# Patient Record
Sex: Female | Born: 1985 | Race: White | Hispanic: No | Marital: Married | Smoking: Never smoker
Health system: Southern US, Community
[De-identification: ages and names within clinical notes are randomized; demographics above are authoritative.]

## PROBLEM LIST (undated history)

## (undated) DIAGNOSIS — E039 Hypothyroidism, unspecified: Secondary | ICD-10-CM

## (undated) DIAGNOSIS — O24419 Gestational diabetes mellitus in pregnancy, unspecified control: Secondary | ICD-10-CM

## (undated) DIAGNOSIS — O139 Gestational [pregnancy-induced] hypertension without significant proteinuria, unspecified trimester: Secondary | ICD-10-CM

## (undated) HISTORY — PX: FACIAL FRACTURE SURGERY: SHX1570

---

## 2016-05-22 DIAGNOSIS — Z01419 Encounter for gynecological examination (general) (routine) without abnormal findings: Secondary | ICD-10-CM | POA: Diagnosis not present

## 2016-05-22 DIAGNOSIS — Z30011 Encounter for initial prescription of contraceptive pills: Secondary | ICD-10-CM | POA: Diagnosis not present

## 2016-08-30 DIAGNOSIS — Z7251 High risk heterosexual behavior: Secondary | ICD-10-CM | POA: Diagnosis not present

## 2018-01-04 DIAGNOSIS — Z3042 Encounter for surveillance of injectable contraceptive: Secondary | ICD-10-CM | POA: Diagnosis not present

## 2018-04-14 DIAGNOSIS — Z01419 Encounter for gynecological examination (general) (routine) without abnormal findings: Secondary | ICD-10-CM | POA: Diagnosis not present

## 2018-04-14 DIAGNOSIS — Z6834 Body mass index (BMI) 34.0-34.9, adult: Secondary | ICD-10-CM | POA: Diagnosis not present

## 2018-04-14 DIAGNOSIS — Z3200 Encounter for pregnancy test, result unknown: Secondary | ICD-10-CM | POA: Diagnosis not present

## 2018-04-14 DIAGNOSIS — Z1151 Encounter for screening for human papillomavirus (HPV): Secondary | ICD-10-CM | POA: Diagnosis not present

## 2018-05-04 DIAGNOSIS — Z1329 Encounter for screening for other suspected endocrine disorder: Secondary | ICD-10-CM | POA: Diagnosis not present

## 2018-05-04 DIAGNOSIS — Z0000 Encounter for general adult medical examination without abnormal findings: Secondary | ICD-10-CM | POA: Diagnosis not present

## 2018-05-04 DIAGNOSIS — Z13220 Encounter for screening for lipoid disorders: Secondary | ICD-10-CM | POA: Diagnosis not present

## 2018-05-04 DIAGNOSIS — Z130 Encounter for screening for diseases of the blood and blood-forming organs and certain disorders involving the immune mechanism: Secondary | ICD-10-CM | POA: Diagnosis not present

## 2018-05-18 DIAGNOSIS — D691 Qualitative platelet defects: Secondary | ICD-10-CM | POA: Diagnosis not present

## 2018-06-16 DIAGNOSIS — W57XXXA Bitten or stung by nonvenomous insect and other nonvenomous arthropods, initial encounter: Secondary | ICD-10-CM | POA: Diagnosis not present

## 2018-06-16 DIAGNOSIS — S80851A Superficial foreign body, right lower leg, initial encounter: Secondary | ICD-10-CM | POA: Diagnosis not present

## 2019-04-29 DIAGNOSIS — Z6835 Body mass index (BMI) 35.0-35.9, adult: Secondary | ICD-10-CM | POA: Diagnosis not present

## 2019-04-29 DIAGNOSIS — Z01419 Encounter for gynecological examination (general) (routine) without abnormal findings: Secondary | ICD-10-CM | POA: Diagnosis not present

## 2019-09-13 DIAGNOSIS — M79673 Pain in unspecified foot: Secondary | ICD-10-CM | POA: Diagnosis not present

## 2019-11-22 DIAGNOSIS — M79672 Pain in left foot: Secondary | ICD-10-CM | POA: Diagnosis not present

## 2019-11-22 DIAGNOSIS — M25562 Pain in left knee: Secondary | ICD-10-CM | POA: Diagnosis not present

## 2020-01-17 DIAGNOSIS — R350 Frequency of micturition: Secondary | ICD-10-CM | POA: Diagnosis not present

## 2020-01-17 DIAGNOSIS — R1032 Left lower quadrant pain: Secondary | ICD-10-CM | POA: Diagnosis not present

## 2020-01-17 DIAGNOSIS — Z3202 Encounter for pregnancy test, result negative: Secondary | ICD-10-CM | POA: Diagnosis not present

## 2020-01-31 DIAGNOSIS — R1031 Right lower quadrant pain: Secondary | ICD-10-CM | POA: Diagnosis not present

## 2020-02-22 DIAGNOSIS — H9193 Unspecified hearing loss, bilateral: Secondary | ICD-10-CM | POA: Diagnosis not present

## 2020-02-22 DIAGNOSIS — H6123 Impacted cerumen, bilateral: Secondary | ICD-10-CM | POA: Diagnosis not present

## 2020-08-10 DIAGNOSIS — Z13220 Encounter for screening for lipoid disorders: Secondary | ICD-10-CM | POA: Diagnosis not present

## 2020-08-10 DIAGNOSIS — Z130 Encounter for screening for diseases of the blood and blood-forming organs and certain disorders involving the immune mechanism: Secondary | ICD-10-CM | POA: Diagnosis not present

## 2020-08-10 DIAGNOSIS — Z6835 Body mass index (BMI) 35.0-35.9, adult: Secondary | ICD-10-CM | POA: Diagnosis not present

## 2020-08-10 DIAGNOSIS — Z0000 Encounter for general adult medical examination without abnormal findings: Secondary | ICD-10-CM | POA: Diagnosis not present

## 2020-08-10 DIAGNOSIS — Z01419 Encounter for gynecological examination (general) (routine) without abnormal findings: Secondary | ICD-10-CM | POA: Diagnosis not present

## 2020-08-10 DIAGNOSIS — Z1329 Encounter for screening for other suspected endocrine disorder: Secondary | ICD-10-CM | POA: Diagnosis not present

## 2020-10-15 DIAGNOSIS — R7989 Other specified abnormal findings of blood chemistry: Secondary | ICD-10-CM | POA: Diagnosis not present

## 2020-10-15 DIAGNOSIS — Z113 Encounter for screening for infections with a predominantly sexual mode of transmission: Secondary | ICD-10-CM | POA: Diagnosis not present

## 2020-11-22 DIAGNOSIS — H52223 Regular astigmatism, bilateral: Secondary | ICD-10-CM | POA: Diagnosis not present

## 2020-11-22 DIAGNOSIS — H53143 Visual discomfort, bilateral: Secondary | ICD-10-CM | POA: Diagnosis not present

## 2021-01-02 DIAGNOSIS — Z1152 Encounter for screening for COVID-19: Secondary | ICD-10-CM | POA: Diagnosis not present

## 2021-01-10 DIAGNOSIS — Z1152 Encounter for screening for COVID-19: Secondary | ICD-10-CM | POA: Diagnosis not present

## 2021-01-11 DIAGNOSIS — Z20822 Contact with and (suspected) exposure to covid-19: Secondary | ICD-10-CM | POA: Diagnosis not present

## 2021-05-01 DIAGNOSIS — Z1329 Encounter for screening for other suspected endocrine disorder: Secondary | ICD-10-CM | POA: Diagnosis not present

## 2021-05-15 DIAGNOSIS — R635 Abnormal weight gain: Secondary | ICD-10-CM | POA: Diagnosis not present

## 2021-05-15 DIAGNOSIS — R5383 Other fatigue: Secondary | ICD-10-CM | POA: Diagnosis not present

## 2021-05-15 DIAGNOSIS — E039 Hypothyroidism, unspecified: Secondary | ICD-10-CM | POA: Diagnosis not present

## 2021-06-03 DIAGNOSIS — E039 Hypothyroidism, unspecified: Secondary | ICD-10-CM | POA: Diagnosis not present

## 2021-06-06 DIAGNOSIS — Z3689 Encounter for other specified antenatal screening: Secondary | ICD-10-CM | POA: Diagnosis not present

## 2021-06-06 DIAGNOSIS — Z3200 Encounter for pregnancy test, result unknown: Secondary | ICD-10-CM | POA: Diagnosis not present

## 2021-06-26 ENCOUNTER — Encounter (HOSPITAL_COMMUNITY): Payer: Self-pay | Admitting: *Deleted

## 2021-06-26 ENCOUNTER — Encounter (HOSPITAL_COMMUNITY)
Admission: AD | Disposition: A | Discharge status: Home / Self Care | Payer: Self-pay | Source: Home / Self Care | Attending: Obstetrics and Gynecology

## 2021-06-26 ENCOUNTER — Inpatient Hospital Stay (HOSPITAL_COMMUNITY): Payer: BC Managed Care – PPO | Admitting: Anesthesiology

## 2021-06-26 ENCOUNTER — Ambulatory Visit (HOSPITAL_COMMUNITY)
Admission: AD | Admit: 2021-06-26 | Discharge: 2021-06-26 | Disposition: A | Discharge status: Home / Self Care | Payer: BC Managed Care – PPO | Attending: Obstetrics and Gynecology | Admitting: Obstetrics and Gynecology

## 2021-06-26 DIAGNOSIS — E039 Hypothyroidism, unspecified: Secondary | ICD-10-CM | POA: Diagnosis not present

## 2021-06-26 DIAGNOSIS — O99280 Endocrine, nutritional and metabolic diseases complicating pregnancy, unspecified trimester: Secondary | ICD-10-CM | POA: Insufficient documentation

## 2021-06-26 DIAGNOSIS — O00101 Right tubal pregnancy without intrauterine pregnancy: Secondary | ICD-10-CM | POA: Insufficient documentation

## 2021-06-26 DIAGNOSIS — Z3A08 8 weeks gestation of pregnancy: Secondary | ICD-10-CM | POA: Diagnosis not present

## 2021-06-26 DIAGNOSIS — Z79890 Hormone replacement therapy (postmenopausal): Secondary | ICD-10-CM | POA: Insufficient documentation

## 2021-06-26 DIAGNOSIS — O009 Unspecified ectopic pregnancy without intrauterine pregnancy: Secondary | ICD-10-CM | POA: Diagnosis not present

## 2021-06-26 DIAGNOSIS — Z20822 Contact with and (suspected) exposure to covid-19: Secondary | ICD-10-CM | POA: Insufficient documentation

## 2021-06-26 HISTORY — DX: Hypothyroidism, unspecified: E03.9

## 2021-06-26 HISTORY — PX: DIAGNOSTIC LAPAROSCOPY WITH REMOVAL OF ECTOPIC PREGNANCY: SHX6449

## 2021-06-26 HISTORY — DX: Unspecified ectopic pregnancy without intrauterine pregnancy: O00.90

## 2021-06-26 HISTORY — PX: LAPAROSCOPIC UNILATERAL SALPINGECTOMY: SHX5934

## 2021-06-26 LAB — COMPREHENSIVE METABOLIC PANEL
ALT: 19 U/L (ref 0–44)
AST: 22 U/L (ref 15–41)
Albumin: 3.8 g/dL (ref 3.5–5.0)
Alkaline Phosphatase: 49 U/L (ref 38–126)
Anion gap: 7 (ref 5–15)
BUN: 11 mg/dL (ref 6–20)
CO2: 24 mmol/L (ref 22–32)
Calcium: 9.2 mg/dL (ref 8.9–10.3)
Chloride: 105 mmol/L (ref 98–111)
Creatinine, Ser: 0.77 mg/dL (ref 0.44–1.00)
GFR, Estimated: >60 mL/min (ref >60)
Glucose, Bld: 102 mg/dL — ABNORMAL HIGH (ref 70–99)
Potassium: 4.2 mmol/L (ref 3.5–5.1)
Sodium: 136 mmol/L (ref 135–145)
Total Bilirubin: 0.8 mg/dL (ref 0.3–1.2)
Total Protein: 7 g/dL (ref 6.5–8.1)

## 2021-06-26 LAB — TYPE AND SCREEN
ABO/RH(D): AB POS
Antibody Screen: NEGATIVE

## 2021-06-26 LAB — CBC
HCT: 40.3 % (ref 36.0–46.0)
Hemoglobin: 13.7 g/dL (ref 12.0–15.0)
MCH: 30.1 pg (ref 26.0–34.0)
MCHC: 34 g/dL (ref 30.0–36.0)
MCV: 88.6 fL (ref 80.0–100.0)
Platelets: 217 10*3/uL (ref 150–400)
RBC: 4.55 MIL/uL (ref 3.87–5.11)
RDW: 12.6 % (ref 11.5–15.5)
WBC: 6 10*3/uL (ref 4.0–10.5)
nRBC: 0 % (ref 0.0–0.2)

## 2021-06-26 LAB — RESP PANEL BY RT-PCR (FLU A&B, COVID) ARPGX2
Influenza A by PCR: NEGATIVE
Influenza B by PCR: NEGATIVE
SARS Coronavirus 2 by RT PCR: NEGATIVE

## 2021-06-26 LAB — HCG, QUANTITATIVE, PREGNANCY: hCG, Beta Chain, Quant, S: 33212 m[IU]/mL — ABNORMAL HIGH (ref <5)

## 2021-06-26 LAB — ABO/RH: ABO/RH(D): AB POS

## 2021-06-26 SURGERY — LAPAROSCOPY, WITH ECTOPIC PREGNANCY SURGICAL TREATMENT
Anesthesia: General | Site: Abdomen | Laterality: Right

## 2021-06-26 MED ORDER — CHLORHEXIDINE GLUCONATE 0.12 % MT SOLN
OROMUCOSAL | Status: AC
  Administered 2021-06-26: 15 mL via OROMUCOSAL
  Filled 2021-06-26: qty 15

## 2021-06-26 MED ORDER — ARTIFICIAL TEARS OPHTHALMIC OINT
TOPICAL_OINTMENT | OPHTHALMIC | Status: AC
  Filled 2021-06-26: qty 3.5

## 2021-06-26 MED ORDER — SUCCINYLCHOLINE CHLORIDE 200 MG/10ML IV SOSY
PREFILLED_SYRINGE | INTRAVENOUS | Status: DC | PRN
  Administered 2021-06-26: 120 mg via INTRAVENOUS

## 2021-06-26 MED ORDER — PROPOFOL 10 MG/ML IV BOLUS
INTRAVENOUS | Status: DC | PRN
  Administered 2021-06-26: 200 mg via INTRAVENOUS

## 2021-06-26 MED ORDER — FENTANYL CITRATE (PF) 250 MCG/5ML IJ SOLN
INTRAMUSCULAR | Status: DC | PRN
  Administered 2021-06-26: 50 ug via INTRAVENOUS
  Administered 2021-06-26: 100 ug via INTRAVENOUS
  Administered 2021-06-26: 50 ug via INTRAVENOUS

## 2021-06-26 MED ORDER — OXYCODONE HCL 5 MG/5ML PO SOLN: 5.0000 mg | Freq: Once | ORAL | Status: AC | PRN

## 2021-06-26 MED ORDER — CHLORHEXIDINE GLUCONATE 0.12 % MT SOLN
15.0000 mL | Freq: Once | OROMUCOSAL | Status: AC
  Filled 2021-06-26: qty 15

## 2021-06-26 MED ORDER — ROCURONIUM BROMIDE 10 MG/ML (PF) SYRINGE
PREFILLED_SYRINGE | INTRAVENOUS | Status: DC | PRN
  Administered 2021-06-26: 30 mg via INTRAVENOUS

## 2021-06-26 MED ORDER — OXYCODONE HCL 5 MG PO TABS
ORAL_TABLET | ORAL | Status: AC
  Filled 2021-06-26: qty 1

## 2021-06-26 MED ORDER — MIDAZOLAM HCL 2 MG/2ML IJ SOLN
INTRAMUSCULAR | Status: DC | PRN
  Administered 2021-06-26: 2 mg via INTRAVENOUS

## 2021-06-26 MED ORDER — SCOPOLAMINE 1 MG/3DAYS TD PT72: 1.0000 | MEDICATED_PATCH | Freq: Once | TRANSDERMAL | Status: DC

## 2021-06-26 MED ORDER — LIDOCAINE 2% (20 MG/ML) 5 ML SYRINGE
INTRAMUSCULAR | Status: DC | PRN
  Administered 2021-06-26: 20 mg via INTRAVENOUS

## 2021-06-26 MED ORDER — KETOROLAC TROMETHAMINE 30 MG/ML IJ SOLN
INTRAMUSCULAR | Status: AC
  Filled 2021-06-26: qty 1

## 2021-06-26 MED ORDER — OXYCODONE HCL 5 MG PO TABS: 5.0000 mg | ORAL_TABLET | Freq: Four times a day (QID) | ORAL | 0 refills | Status: DC | PRN

## 2021-06-26 MED ORDER — BUPIVACAINE HCL (PF) 0.25 % IJ SOLN
INTRAMUSCULAR | Status: DC | PRN
  Administered 2021-06-26: 8 mL
  Administered 2021-06-26: 10 mL

## 2021-06-26 MED ORDER — 0.9 % SODIUM CHLORIDE (POUR BTL) OPTIME
TOPICAL | Status: DC | PRN
  Administered 2021-06-26: 1000 mL

## 2021-06-26 MED ORDER — PROPOFOL 10 MG/ML IV BOLUS
INTRAVENOUS | Status: AC
  Filled 2021-06-26: qty 20

## 2021-06-26 MED ORDER — IBUPROFEN 800 MG PO TABS: 800.0000 mg | ORAL_TABLET | Freq: Three times a day (TID) | ORAL | 0 refills | Status: DC | PRN

## 2021-06-26 MED ORDER — SCOPOLAMINE 1 MG/3DAYS TD PT72
MEDICATED_PATCH | TRANSDERMAL | Status: AC
  Administered 2021-06-26: 1.5 mg via TRANSDERMAL
  Filled 2021-06-26: qty 1

## 2021-06-26 MED ORDER — DEXAMETHASONE SODIUM PHOSPHATE 10 MG/ML IJ SOLN
INTRAMUSCULAR | Status: AC
  Filled 2021-06-26: qty 1

## 2021-06-26 MED ORDER — SUGAMMADEX SODIUM 200 MG/2ML IV SOLN
INTRAVENOUS | Status: DC | PRN
  Administered 2021-06-26: 200 mg via INTRAVENOUS

## 2021-06-26 MED ORDER — OXYCODONE HCL 5 MG PO TABS
5.0000 mg | ORAL_TABLET | Freq: Once | ORAL | Status: AC | PRN
  Administered 2021-06-26: 5 mg via ORAL

## 2021-06-26 MED ORDER — PROMETHAZINE HCL 25 MG/ML IJ SOLN
6.2500 mg | INTRAMUSCULAR | Status: DC | PRN
Start: 2021-06-26 — End: 2021-06-27

## 2021-06-26 MED ORDER — ACETAMINOPHEN 500 MG PO TABS
ORAL_TABLET | ORAL | Status: AC
  Administered 2021-06-26: 1000 mg via ORAL
  Filled 2021-06-26: qty 2

## 2021-06-26 MED ORDER — ONDANSETRON HCL 4 MG/2ML IJ SOLN
INTRAMUSCULAR | Status: DC | PRN
  Administered 2021-06-26: 4 mg via INTRAVENOUS

## 2021-06-26 MED ORDER — CHLORHEXIDINE GLUCONATE 0.12 % MT SOLN
OROMUCOSAL | Status: AC
  Filled 2021-06-26: qty 15

## 2021-06-26 MED ORDER — MIDAZOLAM HCL 2 MG/2ML IJ SOLN
0.5000 mg | Freq: Once | INTRAMUSCULAR | Status: DC | PRN
Start: 2021-06-26 — End: 2021-06-27

## 2021-06-26 MED ORDER — ONDANSETRON HCL 4 MG/2ML IJ SOLN
INTRAMUSCULAR | Status: AC
  Filled 2021-06-26: qty 2

## 2021-06-26 MED ORDER — FENTANYL CITRATE (PF) 250 MCG/5ML IJ SOLN
INTRAMUSCULAR | Status: AC
  Filled 2021-06-26: qty 5

## 2021-06-26 MED ORDER — MIDAZOLAM HCL 2 MG/2ML IJ SOLN
INTRAMUSCULAR | Status: AC
  Filled 2021-06-26: qty 2

## 2021-06-26 MED ORDER — BUPIVACAINE HCL (PF) 0.25 % IJ SOLN
INTRAMUSCULAR | Status: AC
  Filled 2021-06-26: qty 30

## 2021-06-26 MED ORDER — MEPERIDINE HCL 25 MG/ML IJ SOLN
6.2500 mg | INTRAMUSCULAR | Status: DC | PRN
Start: 2021-06-26 — End: 2021-06-27

## 2021-06-26 MED ORDER — LACTATED RINGERS IV SOLN: INTRAVENOUS | Status: DC | PRN

## 2021-06-26 MED ORDER — LIDOCAINE 2% (20 MG/ML) 5 ML SYRINGE
INTRAMUSCULAR | Status: AC
  Filled 2021-06-26: qty 5

## 2021-06-26 MED ORDER — HYDROMORPHONE HCL 1 MG/ML IJ SOLN: 0.2500 mg | INTRAMUSCULAR | Status: DC | PRN

## 2021-06-26 MED ORDER — DEXAMETHASONE SODIUM PHOSPHATE 10 MG/ML IJ SOLN
INTRAMUSCULAR | Status: DC | PRN
  Administered 2021-06-26: 5 mg via INTRAVENOUS

## 2021-06-26 MED ORDER — KETOROLAC TROMETHAMINE 30 MG/ML IJ SOLN
INTRAMUSCULAR | Status: DC | PRN
  Administered 2021-06-26: 30 mg via INTRAVENOUS

## 2021-06-26 MED ORDER — ACETAMINOPHEN 500 MG PO TABS: 1000.0000 mg | ORAL_TABLET | Freq: Once | ORAL | Status: AC

## 2021-06-26 SURGICAL SUPPLY — 32 items
BAG COUNTER SPONGE SURGICOUNT (BAG) ×2 IMPLANT
CATH ROBINSON RED A/P 16FR (CATHETERS) ×2 IMPLANT
DERMABOND ADVANCED (GAUZE/BANDAGES/DRESSINGS) ×1
DERMABOND ADVANCED .7 DNX12 (GAUZE/BANDAGES/DRESSINGS) ×1 IMPLANT
DRSG OPSITE POSTOP 3X4 (GAUZE/BANDAGES/DRESSINGS) IMPLANT
GLOVE SURG LTX SZ6.5 (GLOVE) ×2 IMPLANT
GLOVE SURG UNDER POLY LF SZ6.5 (GLOVE) ×4 IMPLANT
GLOVE SURG UNDER POLY LF SZ7 (GLOVE) ×4 IMPLANT
GOWN STRL REUS W/ TWL LRG LVL3 (GOWN DISPOSABLE) ×4 IMPLANT
GOWN STRL REUS W/TWL LRG LVL3 (GOWN DISPOSABLE) ×4
IRRIGATION STRYKERFLOW (MISCELLANEOUS) ×1 IMPLANT
IRRIGATOR STRYKERFLOW (MISCELLANEOUS) ×2
KIT TURNOVER KIT B (KITS) ×2 IMPLANT
MANIPULATOR UTERINE 4.5 ZUMI (MISCELLANEOUS) ×2 IMPLANT
PACK LAPAROSCOPY BASIN (CUSTOM PROCEDURE TRAY) ×2 IMPLANT
PACK TRENDGUARD 450 HYBRID PRO (MISCELLANEOUS) IMPLANT
POUCH SPECIMEN RETRIEVAL 10MM (ENDOMECHANICALS) ×2 IMPLANT
PROTECTOR NERVE ULNAR (MISCELLANEOUS) ×2 IMPLANT
SCISSORS LAP 5X35 DISP (ENDOMECHANICALS) IMPLANT
SET TUBE SMOKE EVAC HIGH FLOW (TUBING) ×2 IMPLANT
SHEARS HARMONIC ACE PLUS 36CM (ENDOMECHANICALS) ×2 IMPLANT
SLEEVE ENDOPATH XCEL 5M (ENDOMECHANICALS) IMPLANT
SUT VIC AB 3-0 PS2 18 (SUTURE) ×2 IMPLANT
SUT VICRYL 0 UR6 27IN ABS (SUTURE) ×4 IMPLANT
SUT VICRYL 4-0 PS2 18IN ABS (SUTURE) ×2 IMPLANT
SYR 5ML LL (SYRINGE) ×2 IMPLANT
SYSTEM CARTER THOMASON II (TROCAR) ×2 IMPLANT
TOWEL GREEN STERILE FF (TOWEL DISPOSABLE) ×4 IMPLANT
TRENDGUARD 450 HYBRID PRO PACK (MISCELLANEOUS)
TROCAR XCEL NON-BLD 11X100MML (ENDOMECHANICALS) IMPLANT
TROCAR XCEL NON-BLD 5MMX100MML (ENDOMECHANICALS) ×2 IMPLANT
WARMER LAPAROSCOPE (MISCELLANEOUS) ×2 IMPLANT

## 2021-06-26 NOTE — H&P (Signed)
Erin May is an 35 y.o. female married female, G1, was seen in office for Pregnancy confirmation ultrasound. 8 wks by LMP of 04/27/21, regular menses.  G1, hypothyroidism, on Synthroid, spontaneous pregnancy, no infertility history. No PID or STI history.  Office sono noted - no IUP, thick EMS at 20 mm and right adnexal mass 2.7 cm x 2.3 cm x 3.7 cm with peripheral flow, no Yolk sac or fetal pole, no free fluid. Ovaries seen -both wnl, right with CLC.  Patient denies pain/ vag bleeding /back pain  Patient's last menstrual period was 04/27/2021.  No hx of abdominal/pelvic surgery or infections  Past Medical History:  Diagnosis Date   Hypothyroidism     Past Surgical History:  Procedure Laterality Date   FACIAL FRACTURE SURGERY     pt was 35 yrs old    History reviewed. No pertinent family history.  Social History:  reports that she has never smoked. She has never used smokeless tobacco. She reports previous alcohol use. She reports that she does not use drugs.  Allergies: No Known Allergies  Medications Prior to Admission  Medication Sig Dispense Refill Last Dose   calcium-vitamin D (OSCAL WITH D) 500-200 MG-UNIT tablet Take 1 tablet by mouth.      levothyroxine (SYNTHROID) 25 MCG tablet Take 25 mcg by mouth daily before breakfast.   06/26/2021   Prenatal Vit-Fe Fumarate-FA (PRENATAL MULTIVITAMIN) TABS tablet Take 1 tablet by mouth daily at 12 noon.   06/26/2021    Review of Systems above  Blood pressure 127/82, pulse 69, temperature 98.3 F (36.8 C), temperature source Oral, resp. rate 18, height 5\' 4"  (1.626 m), weight 99.5 kg, last menstrual period 04/27/2021, SpO2 100 %. Physical Exam A&O x 3, no acute distress. Pleasant HEENT neg, no thyromegaly Lungs CTA bilat CV RRR, S1S2 normal Abdo soft, non tender, non acute Extr no edema/ tenderness Pelvic deferred due to risk of ectopic rupture   A/P:  35 yo G1, 8 wks by sure LMP. Office sono with right adnexal mass measuring  3.7x.2.7x 2.3 cm and no IUP, presumed right ectopic pregnancy, unruptured at present and asymptomatic female. Size >3.5 cm but no fetal pole, no free fluid, no pain- stat Quant from MAU to discuss options- surgery vs methotrexate.  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 So she wont be available for D4 labs and close f/up, in which case she is better candidate to have Laparoscopic salpingectomy. She is sent to MAU for stat labs and counseling. Will have Dr 11-04-1983 check on her after her labs are back.  Pt will remain NPO (last ate at [facility] last night)   Erin May 06/26/2021, 1:07 PM

## 2021-06-26 NOTE — Op Note (Signed)
Preoperative diagnosis: right ectopic pregnancy Postoperative diagnosis: Same, filmy adhesions in pelvis, bowel to abdominal wall and pelvic sidewall Procedure: Laparoscopic right salpingectomy, LOA Surgeon: Dr Rhoderick Moody, MD Assistants: Shea Evans, MD Anesthesia Gen. Endotracheal IV fluids LR: EBL : 55ml Urine clear (straight cath pre-op): Complications none Disposition PACU and home Specimens right tube/pregnancy  Procedure Patient was seen in MAU and ectopic/adnexal mass, high BHCG reviewed and not MTX candidate.  Recommend proceed with salpingectomy.  Discussed Risk and complications of surgery including infection, bleeding, damage to internal organs, other complications including pneumonia, VTE were reviewed. Informed written consent was obtained and patient was brought to the operating room with IV running. Timeout was carried out. She underwent general anesthesia without difficulty and was given dorsal lithotomy position.  EUA deferred. The patient was prepped and draped in standard fashion. The bladder was emptied with straight catheter with clear urine was noted. Speculum was placed anterior lip of cervix was grasped with tenaculum, uterus was sounded to 9 cm and was AV. A hulka manipulator was placed w/o difficulty.   Gloves were changed attention was focused on the abdomen. A 10 mm vertical incision was made at the umbilicus after injecting 10 cc Marcaine.   Incision was made with the scalpel to allow placement of a 95mm trocar.  This was advanced into the abdomen with optiview.   Opening pressure was , and the abdomen was then insuflated to .  Marcaine was then injected and then scalpel used to make incision in rlq to allow placement for 64mm trocar.  Trocar advanced under visualization.  This was also done in the llq in a similar fashionl.  The patient was placed in Trendelenburg position.  Few filmy adhesions noted in pelvis of right sidewall to bowel,  left sidewall/bowel, and then a thin adhesion from bowel to abdominal wall midline.  Liver/gallbladder appeared wnl, no adhesions noted.  No bleeding was noted. Uterus appears wnl, left tube/ovary wnl, large mass right fallopian tube, nml appearing right ovary.  The harmonic scalpel was advanced into the abdomen and the thin adhesion from abdominal wall to bowel was easily grasped and transected with the harmonic scalpel.  There was good hemostasis.  Attention was then drawn to the right fallopian tube which was grasped with a grasper and then the harmonic scalpel was used to remove the tube.  Good hemostasis noted of pedicle.  Endopouch was then placed into the umbilical port and the tube/ectopic was then placed in the pouch w/o difficulty.  The trocar was then removed and the pouch was pulled through the incision. A carter-thompson device was placed into the umbilical incision but difficulty with the suture to be able to close, so this was removed and all trocars removed.  Gas was expelled from the abdomen and then the facis was grasped with kocher clamps and then the incision was then closed with 0-vicryl.  The subcutaneous was closed with 3-0 vicryl.  The skin was closed with 4-0 vicryl. Good hemostasis noted of incisions. Dermabond was applied at all the incisions.  The Hulka manipulator was removed from the uterus. Hemostasis was excellent. Instruments were removed.   All counts were correct x2 patient was reversed from general anesthesia extubated and brought out to the recovery room in stable condition. Plan is to discharge home from recovery room. Surgical findings were discussed with patient's family.  The patient will f/u with Dr Juliene Pina in 2 weeks.

## 2021-06-26 NOTE — Anesthesia Preprocedure Evaluation (Addendum)
Anesthesia Evaluation  Patient identified by MRN, date of birth, ID band Patient awake    Reviewed: Allergy & Precautions, NPO status , Patient's Chart, lab work & pertinent test results  History of Anesthesia Complications Negative for: history of anesthetic complications  Airway Mallampati: I  TM Distance: >3 FB Neck ROM: Full    Dental  (+) Chipped, Dental Advisory Given   Pulmonary neg pulmonary ROS,  06/26/2021 SARS coronavirus NEG   breath sounds clear to auscultation       Cardiovascular (-) hypertension(-) anginanegative cardio ROS   Rhythm:Regular Rate:Normal     Neuro/Psych negative neurological ROS     GI/Hepatic Neg liver ROS, GERD  Controlled,  Endo/Other  Hypothyroidism Morbid obesity  Renal/GU negative Renal ROS     Musculoskeletal   Abdominal   Peds  Hematology negative hematology ROS (+)   Anesthesia Other Findings   Reproductive/Obstetrics (+) Pregnancy (ectopic)                            Anesthesia Physical Anesthesia Plan  ASA: 2 and emergent  Anesthesia Plan: General   Post-op Pain Management:    Induction: Intravenous and Rapid sequence  PONV Risk Score and Plan: 3 and Ondansetron, Dexamethasone and Scopolamine patch - Pre-op  Airway Management Planned: Oral ETT  Additional Equipment: None  Intra-op Plan:   Post-operative Plan: Extubation in OR  Informed Consent: I have reviewed the patients History and Physical, chart, labs and discussed the procedure including the risks, benefits and alternatives for the proposed anesthesia with the patient or authorized representative who has indicated his/her understanding and acceptance.     Dental advisory given  Plan Discussed with: CRNA and Surgeon  Anesthesia Plan Comments:        Anesthesia Quick Evaluation

## 2021-06-26 NOTE — Progress Notes (Signed)
Labs reviewed and d/w pt/. Quant at [phone] and with a large 3.7 cm adnexal mass with g.sac, even though not in pain, she has high failure rate for methotrexate and recommend Laparoscopic salpingectomy.  She voiced understanding and agrees.  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   Risks/complications of surgery reviewed incl infection, bleeding, damage to internal organs including bladder, bowels, ureters, blood vessels, other risks from anesthesia, VTE and delayed complications of any surgery, complications in future surgery reviewed.  Informed written consent obtained.   --Shea Evans MD

## 2021-06-26 NOTE — MAU Note (Signed)
Called Dr Juliene Pina regarding patient's orders and care. Dr Juliene Pina stated that she wanted STAT orders (CBC, HCG, CMP) and would be managing her care based on these labs.

## 2021-06-26 NOTE — MAU Note (Signed)
Dr Amado Nash requests Covid test on pt.

## 2021-06-26 NOTE — MAU Note (Signed)
Sent over for HCG level . (Had received call from office , ectopic ? MTX or OC).  Feeling ok, no pain, little pressure. No bleeding.

## 2021-06-26 NOTE — Anesthesia Postprocedure Evaluation (Signed)
Anesthesia Post Note  Patient: Erin May  Procedure(s) Performed: DIAGNOSTIC LAPAROSCOPY WITH REMOVAL OF ECTOPIC PREGNANCY (Right: Abdomen) LAPAROSCOPIC RIGHT SALPINGECTOMY (Right: Abdomen)     Patient location during evaluation: PACU Anesthesia Type: General Level of consciousness: awake and alert Pain management: pain level controlled Vital Signs Assessment: post-procedure vital signs reviewed and stable Respiratory status: spontaneous breathing, nonlabored ventilation, respiratory function stable and patient connected to nasal cannula oxygen Cardiovascular status: blood pressure returned to baseline and stable Postop Assessment: no apparent nausea or vomiting Anesthetic complications: no   No notable events documented.  Last Vitals:  Vitals:   06/26/21 1950 06/26/21 2005  BP: 138/87 134/84  Pulse: 71 66  Resp: 14 17  Temp:  36.6 C  SpO2: 100% 97%    Last Pain:  Vitals:   06/26/21 1950  TempSrc:   PainSc: 2                  Jennet Scroggin

## 2021-06-26 NOTE — Transfer of Care (Signed)
Immediate Anesthesia Transfer of Care Note  Patient: Erin May  Procedure(s) Performed: DIAGNOSTIC LAPAROSCOPY WITH REMOVAL OF ECTOPIC PREGNANCY (Right: Abdomen) LAPAROSCOPIC RIGHT SALPINGECTOMY (Right: Abdomen)  Patient Location: PACU  Anesthesia Type:General  Level of Consciousness: awake, alert  and oriented  Airway & Oxygen Therapy: Patient Spontanous Breathing and Patient connected to face mask oxygen  Post-op Assessment: Report given to RN and Post -op Vital signs reviewed and stable  Post vital signs: Reviewed and stable  Last Vitals:  Vitals Value Taken Time  BP 137/86 06/26/21 1919  Temp    Pulse 69 06/26/21 1922  Resp 13 06/26/21 1922  SpO2 100 % 06/26/21 1922  Vitals shown include unvalidated device data.  Last Pain:  Vitals:   06/26/21 1219  TempSrc: Oral         Complications: No notable events documented.

## 2021-06-26 NOTE — Anesthesia Procedure Notes (Signed)
Procedure Name: Intubation Date/Time: 06/26/2021 5:46 PM Performed by: Alain Marion, CRNA Pre-anesthesia Checklist: Patient identified, Emergency Drugs available, Suction available and Patient being monitored Patient Re-evaluated:Patient Re-evaluated prior to induction Oxygen Delivery Method: Circle System Utilized Preoxygenation: Pre-oxygenation with 100% oxygen Induction Type: IV induction Ventilation: Mask ventilation without difficulty Laryngoscope Size: Mac and 3 Grade View: Grade II Tube type: Oral Tube size: 7.0 mm Number of attempts: 1 Airway Equipment and Method: Stylet and Oral airway Placement Confirmation: ETT inserted through vocal cords under direct vision, positive ETCO2 and breath sounds checked- equal and bilateral Secured at: 21 cm Tube secured with: Tape Dental Injury: Teeth and Oropharynx as per pre-operative assessment  Comments: Performed by Hedy Jacob.

## 2021-06-27 ENCOUNTER — Encounter (HOSPITAL_COMMUNITY): Payer: Self-pay | Admitting: Obstetrics and Gynecology

## 2021-07-01 LAB — SURGICAL PATHOLOGY

## 2021-11-22 ENCOUNTER — Other Ambulatory Visit: Payer: Self-pay

## 2021-11-22 ENCOUNTER — Inpatient Hospital Stay (HOSPITAL_COMMUNITY)
Admission: AD | Admit: 2021-11-22 | Discharge: 2021-11-22 | Disposition: A | Discharge status: Home / Self Care | Payer: BC Managed Care – PPO | Attending: Obstetrics & Gynecology | Admitting: Obstetrics & Gynecology

## 2021-11-22 ENCOUNTER — Encounter (HOSPITAL_COMMUNITY): Payer: Self-pay | Admitting: Obstetrics & Gynecology

## 2021-11-22 DIAGNOSIS — S61052A Open bite of left thumb without damage to nail, initial encounter: Secondary | ICD-10-CM | POA: Diagnosis not present

## 2021-11-22 DIAGNOSIS — Z3A01 Less than 8 weeks gestation of pregnancy: Secondary | ICD-10-CM | POA: Insufficient documentation

## 2021-11-22 DIAGNOSIS — W5501XA Bitten by cat, initial encounter: Secondary | ICD-10-CM | POA: Diagnosis not present

## 2021-11-22 DIAGNOSIS — O26891 Other specified pregnancy related conditions, first trimester: Secondary | ICD-10-CM | POA: Diagnosis not present

## 2021-11-22 DIAGNOSIS — Z3201 Encounter for pregnancy test, result positive: Secondary | ICD-10-CM | POA: Diagnosis not present

## 2021-11-22 MED ORDER — AMOXICILLIN-POT CLAVULANATE 875-125 MG PO TABS: 1.0000 | ORAL_TABLET | Freq: Two times a day (BID) | ORAL | 0 refills | Status: AC

## 2021-11-22 NOTE — MAU Provider Note (Signed)
History     CSN: [id number]  Arrival date and time: 11/22/21 1444   Event Date/Time   First Provider Initiated Contact with Patient 11/22/21 1525      Chief Complaint  Patient presents with   Animal Bite   HPI Erin May is a 34 y.o. G2P0 at [redacted]w[redacted]d who presents to MAU for evaluation of a cat bite on her left thumb. Her pet cat bit her yesterday while the patient was trying to put her in a crate. She endorses new onset redness, tightness near the joint and redness. She denies change in functionality of her thumb or hand. She states her cat is up to date on all vaccinations. Patient denies fever, muscle aches.  Patient was at [facility]   OB History     Gravida  2   Para      Term      Preterm      AB      Living         SAB      IAB      Ectopic      Multiple      Live Births              Past Medical History:  Diagnosis Date   Ectopic pregnancy without intrauterine pregnancy 06/26/2021   Hypothyroidism     Past Surgical History:  Procedure Laterality Date   DIAGNOSTIC LAPAROSCOPY WITH REMOVAL OF ECTOPIC PREGNANCY Right 06/26/2021   Procedure: DIAGNOSTIC LAPAROSCOPY WITH REMOVAL OF ECTOPIC PREGNANCY;  Surgeon: Vick Frees, MD;  Location: [facility] OR;  Service: Gynecology;  Laterality: Right;   FACIAL FRACTURE SURGERY     pt was 35 yrs old   LAPAROSCOPIC UNILATERAL SALPINGECTOMY Right 06/26/2021   Procedure: LAPAROSCOPIC RIGHT SALPINGECTOMY;  Surgeon: Vick Frees, MD;  Location: [facility] OR;  Service: Gynecology;  Laterality: Right;    No family history on file.  Social History   Tobacco Use   Smoking status: Never   Smokeless tobacco: Never  Vaping Use   Vaping Use: Never used  Substance Use Topics   Alcohol use: Not Currently   Drug use: Never    Allergies: No Known Allergies  Medications Prior to Admission  Medication Sig Dispense Refill Last Dose   calcium-vitamin D (OSCAL WITH D) 500-200 MG-UNIT tablet Take 1 tablet by mouth.       ibuprofen (ADVIL) 800 MG tablet Take 1 tablet (800 mg total) by mouth every 8 (eight) hours as needed. 30 tablet 0    levothyroxine (SYNTHROID) 25 MCG tablet Take 25 mcg by mouth daily before breakfast.      oxyCODONE (OXY IR/ROXICODONE) 5 MG immediate release tablet Take 1 tablet (5 mg total) by mouth every 6 (six) hours as needed for severe pain. 15 tablet 0     Review of Systems  Constitutional:  Negative for chills, fatigue and fever.  Respiratory:  Negative for shortness of breath.   Cardiovascular:  Negative for chest pain and palpitations.  Gastrointestinal:  Negative for abdominal pain.  Genitourinary:  Negative for vaginal bleeding and vaginal discharge.  Skin:  Positive for wound.  All other systems reviewed and are negative. Physical Exam   Blood pressure 132/85, pulse 73, temperature 98.4 F (36.9 C), temperature source Oral, resp. rate 18, height 5\' 4"  (1.626 m), weight 100.3 kg, last menstrual period 10/19/2021, SpO2 100 %, unknown if currently breastfeeding.  Physical Exam Vitals and nursing note reviewed. Exam conducted with a chaperone  present.  Constitutional:      Appearance: She is not ill-appearing.  Cardiovascular:     Rate and Rhythm: Normal rate.     Pulses: Normal pulses.  Pulmonary:     Effort: Pulmonary effort is normal.  Skin:    Comments: Open wound with erythema and swelling. Warm to touch. No foul smelling drainage, no impact on joint flexion at thumb or wrist.  Neurological:     Mental Status: She is alert.    MAU Course  Procedures  --Discussed with Dr. Alvester Morin. Concern for infection. Will treat with Augmentin as advised by Dr. Alvester Morin and as recommended in UTD.  Patient Vitals for the past 24 hrs:  BP Temp Temp src Pulse Resp SpO2 Height Weight  11/22/21 1531 132/85 98.4 F (36.9 C) Oral 73 18 100 % 5\' 4"  (1.626 m) 100.3 kg   Meds ordered this encounter  Medications   amoxicillin-clavulanate (AUGMENTIN) 875-125 MG tablet    Sig: Take 1  tablet by mouth 2 (two) times daily for 7 days.    Dispense:  14 tablet    Refill:  0    Order Specific Question:   Supervising Provider    Answer:   [[id number]]          Media Information   MAU Course  --35 y.o. G2P0 at [redacted]w[redacted]d  --Cat bite, initial encounter --Family pet, up to date on vaccinations --New outpatient antibiotic regimen --Signs of worsening infection reviewed with patient and her mother in law --Discharge home in stable condition  F/U: Patient advised to have wound checked Monday 12/05 at [facility], CNM 11/22/2021, 7:10 PM

## 2021-11-22 NOTE — MAU Note (Addendum)
Yesterday, cat got out of the house and when she went to put it back , the cat bit her. Thumb is sore and has some swelling. puncture noted on left thumb. Scratches on rt forearm.  Preg confirmed at [facility] this morning.  They suggested she come here. She has appt tomorrow for tetanus.

## 2021-11-23 DIAGNOSIS — L03012 Cellulitis of left finger: Secondary | ICD-10-CM | POA: Diagnosis not present

## 2021-11-23 DIAGNOSIS — Z23 Encounter for immunization: Secondary | ICD-10-CM | POA: Diagnosis not present

## 2021-11-23 DIAGNOSIS — S61452A Open bite of left hand, initial encounter: Secondary | ICD-10-CM | POA: Diagnosis not present

## 2021-11-23 DIAGNOSIS — W5501XA Bitten by cat, initial encounter: Secondary | ICD-10-CM | POA: Diagnosis not present

## 2021-11-26 DIAGNOSIS — Z3201 Encounter for pregnancy test, result positive: Secondary | ICD-10-CM | POA: Diagnosis not present

## 2021-12-02 DIAGNOSIS — Z3201 Encounter for pregnancy test, result positive: Secondary | ICD-10-CM | POA: Diagnosis not present

## 2021-12-30 DIAGNOSIS — Z124 Encounter for screening for malignant neoplasm of cervix: Secondary | ICD-10-CM | POA: Diagnosis not present

## 2021-12-30 DIAGNOSIS — Z3689 Encounter for other specified antenatal screening: Secondary | ICD-10-CM | POA: Diagnosis not present

## 2021-12-30 DIAGNOSIS — Z3A10 10 weeks gestation of pregnancy: Secondary | ICD-10-CM | POA: Diagnosis not present

## 2021-12-30 DIAGNOSIS — O99211 Obesity complicating pregnancy, first trimester: Secondary | ICD-10-CM | POA: Diagnosis not present

## 2021-12-30 LAB — OB RESULTS CONSOLE GC/CHLAMYDIA
Chlamydia: NEGATIVE
Neisseria Gonorrhea: NEGATIVE

## 2021-12-30 LAB — OB RESULTS CONSOLE HIV ANTIBODY (ROUTINE TESTING): HIV: NONREACTIVE

## 2021-12-30 LAB — OB RESULTS CONSOLE HEPATITIS B SURFACE ANTIGEN: Hepatitis B Surface Ag: NEGATIVE

## 2021-12-30 LAB — OB RESULTS CONSOLE RPR: RPR: NONREACTIVE

## 2021-12-30 LAB — OB RESULTS CONSOLE RUBELLA ANTIBODY, IGM: Rubella: IMMUNE

## 2022-01-10 DIAGNOSIS — E039 Hypothyroidism, unspecified: Secondary | ICD-10-CM | POA: Diagnosis not present

## 2022-02-03 DIAGNOSIS — Z361 Encounter for antenatal screening for raised alphafetoprotein level: Secondary | ICD-10-CM | POA: Diagnosis not present

## 2022-02-11 ENCOUNTER — Other Ambulatory Visit: Payer: Self-pay | Admitting: Obstetrics & Gynecology

## 2022-02-11 DIAGNOSIS — Z363 Encounter for antenatal screening for malformations: Secondary | ICD-10-CM

## 2022-02-12 DIAGNOSIS — E039 Hypothyroidism, unspecified: Secondary | ICD-10-CM | POA: Diagnosis not present

## 2022-02-14 ENCOUNTER — Other Ambulatory Visit: Payer: BC Managed Care – PPO

## 2022-02-14 ENCOUNTER — Ambulatory Visit: Payer: BC Managed Care – PPO

## 2022-02-20 DIAGNOSIS — Z331 Pregnant state, incidental: Secondary | ICD-10-CM | POA: Diagnosis not present

## 2022-02-20 DIAGNOSIS — E039 Hypothyroidism, unspecified: Secondary | ICD-10-CM | POA: Diagnosis not present

## 2022-02-26 ENCOUNTER — Other Ambulatory Visit: Payer: Self-pay

## 2022-02-27 ENCOUNTER — Other Ambulatory Visit: Payer: Self-pay | Admitting: Obstetrics & Gynecology

## 2022-02-27 ENCOUNTER — Other Ambulatory Visit: Payer: Self-pay | Admitting: *Deleted

## 2022-02-27 ENCOUNTER — Ambulatory Visit (HOSPITAL_BASED_OUTPATIENT_CLINIC_OR_DEPARTMENT_OTHER): Payer: BC Managed Care – PPO

## 2022-02-27 ENCOUNTER — Other Ambulatory Visit: Payer: Self-pay

## 2022-02-27 ENCOUNTER — Ambulatory Visit: Payer: BC Managed Care – PPO | Admitting: *Deleted

## 2022-02-27 ENCOUNTER — Ambulatory Visit: Payer: BC Managed Care – PPO

## 2022-02-27 ENCOUNTER — Encounter: Payer: Self-pay | Admitting: *Deleted

## 2022-02-27 ENCOUNTER — Ambulatory Visit: Payer: BC Managed Care – PPO | Attending: Obstetrics & Gynecology

## 2022-02-27 VITALS — BP 134/71 | HR 90

## 2022-02-27 DIAGNOSIS — O285 Abnormal chromosomal and genetic finding on antenatal screening of mother: Secondary | ICD-10-CM | POA: Insufficient documentation

## 2022-02-27 DIAGNOSIS — Z0389 Encounter for observation for other suspected diseases and conditions ruled out: Secondary | ICD-10-CM

## 2022-02-27 DIAGNOSIS — Q970 Karyotype 47, XXX: Secondary | ICD-10-CM | POA: Insufficient documentation

## 2022-02-27 DIAGNOSIS — Z141 Cystic fibrosis carrier: Secondary | ICD-10-CM | POA: Insufficient documentation

## 2022-02-27 DIAGNOSIS — O09899 Supervision of other high risk pregnancies, unspecified trimester: Secondary | ICD-10-CM | POA: Diagnosis not present

## 2022-02-27 DIAGNOSIS — O99212 Obesity complicating pregnancy, second trimester: Secondary | ICD-10-CM

## 2022-02-27 DIAGNOSIS — Z363 Encounter for antenatal screening for malformations: Secondary | ICD-10-CM | POA: Diagnosis not present

## 2022-02-27 DIAGNOSIS — Z3A18 18 weeks gestation of pregnancy: Secondary | ICD-10-CM

## 2022-02-27 NOTE — Addendum Note (Signed)
Addended by: Teena Dunk on: 02/27/2022 12:05 PM ? ? Modules accepted: Orders ? ?

## 2022-02-27 NOTE — Progress Notes (Signed)
Name: Erin May Indication: Discuss abnormal Non-Invasive Prenatal Screen (NIPS) result & abnormal carrier screening result  DOB: 08/08/1986 Age: 36 y.o.   EDC: 07/26/2022 LMP: 10/14/2021 Referring Provider:  Shea Evans, MD  EGA: [redacted]w[redacted]d Genetic Counselor: Teena Dunk, MS, CGC  OB Hx: [id number] Date of Appointment: 02/27/2022  Accompanied by: Father of the pregnancy Erin May) Face to Face Time: 60 Minutes   Previous Testing Completed:  - CBC from 12/30/2021 reviewed. MCV within normal limits. It is unlikely that Dafne is a beta thalassemia carrier or an alpha thalassemia carrier of the double-gene deletion. Individuals with a normal MCV may be single-gene deletion carriers, but it is unlikely that the current pregnancy would be affected with alpha or beta thalassemia major.  - Hemoglobin fractionation cascade from 12/30/2021 reviewed. No abnormal hemoglobin bands were noted.   - Maternal serum AFP from 02/03/2022 reviewed. The result is screen negative. A negative result reduces the risk that the current pregnancy has an open neural tube defect. Closed neural tube defects and some open defects may not be detected by this screen.   - Non-Invasive Prenatal Screening (NIPS) from 12/30/2021 reviewed. The result is low risk for Down syndrome, Trisomy 18, and Trisomy 13. 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 Due to this likely maternal finding, analysis for fetal sex chromosome aneuploidies could not be completed and the fetal sex was predicted to be female based on the absence of the Y chromosome. See Genetic Counseling portion of the note below.  - Carrier screening from 12/30/2021 reviewed.  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 See Genetic Counseling portion of the note below.  Moet has inconclusive results for Duchenne/Becker Muscular Dystrophy.  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 See Genetic Counseling portion of the note below.  Moneisha screened to not be a carrier for Fragile X syndrome. She has 30, 29, and 24 CGG repeats in the FMR1 genes. See Genetic Counseling portion of the note below.  Kammi screened to not be a carrier for Spinal Muscular Atrophy (two copies of the SMN1 gene and g.27134T>G absent. The absence of the g.27134T>G variant decreases the chance to be a silent (2+0) carrier.     Medical History:  This is [name]'s 2nd pregnancy together with her partner, Erin May. Henleigh has had one ectopic pregnancy. Reports she takes prenatal vitamins, D3, and levothyroxine. Personal history of hypothyroidism.  Denies personal history of diabetes, high blood pressure, and seizures. Denies bleeding, infections, and fevers in this pregnancy.    Family History: A pedigree was created and scanned into Epic under the Media tab. Maternal ethnicity reported as Caucasian and paternal ethnicity reported as Caucasian. Denies Ashkenazi Jewish ancestry. Family history not remarkable for consanguinity, individuals with birth defects, intellectual disability, autism spectrum disorder, multiple spontaneous abortions, still births, or unexplained neonatal death.      Genetic Counseling:   Non-Invasive Prenatal Screening (NIPS) Results. Erin May previously completed NIPS in this pregnancy. The result is low risk for Down syndrome, Trisomy 18, and Trisomy 13. 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 Due to this likely maternal finding, analysis for fetal sex chromosome aneuploidies could not be completed and the fetal sex was predicted  to be female based on the absence of the Y chromosome. Genetic counseling reviewed the NIPS technology  and result with Erin May and Erin May in detail. We discussed that it is unlikely for the pregnancy to be affected with Down syndrome, Trisomy 13, or Trisomy 18 given the result. We reviewed that screening for sex chromosome abnormalities could not be completed as the result suggests August is affected with Trisomy X. Trisomy X is a chromosomal disorder that affects females and is characterized by the presence of an additional X chromosome. Typically, females have two X chromosomes (46,XX); however, females with Trisomy X have three X chromosomes (47,XXX). Trisomy X is not an inherited disorder. The presence of the extra X chromosome results from sporadic, random errors during the normal division of reproductive cells in one of the parents (nondisjunction during meiosis). Symptoms of individuals with Trisomy X vary, and many individuals with the condition remain undiagnosed throughout life. The most common physical feature affected individuals have is tall stature. Other physical features may include epicanthal folds, hypertelorism, small head circumference, hypotonia, and 5th finger clinodactyly. There is typically normal sexual development and affected individuals can become pregnant. Some girls and women with Trisomy X syndrome have intelligence in the normal range, but possibly slightly lower when compared with siblings. Occasionally, affected individuals have delayed development in speech and language skills as well as motor skills such as sitting up and walking. There may also be difficulties with reading or understanding math, behavioral problems (ADHD or symptoms of Autism), psychological problems (anxiety or depression), and problems with information processing. Genetic counseling offered Erin May the option of maternal chromosome testing in the form of a 5 cell count karyotype and microarray analysis. Erin May accepted maternal chromosome testing and her blood was drawn today. Genetic counseling reviewed with  Erin May that if the maternal chromosome analysis confirms Trisomy X syndrome we would refer her for an adult genetics consultation with a geneticist at either [place] or [facility]. Genetic counseling reviewed with Shamecka that it is unlikely for her current pregnancy to have a sex chromosome condition, however we cannot say this with certainty at this time. We offered Kristel the option of amniocentesis for prenatal diagnosis. Possible procedural difficulties and complications that can arise include maternal infection, cramping, bleeding, fluid leakage, and/or pregnancy loss. The risk for pregnancy loss with an amniocentesis is 1/500. Cambree declined amniocentesis for prenatal diagnosis at this time, however, stated she may consider amniocentesis depending on the results from her maternal chromosome testing.   Duchenne/Becker Muscular Dystrophy Carrier Screening Result. Lamyra has inconclusive results for Duchenne/Becker Muscular Dystrophy which is an X-linked condition. 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 We discussed that this result supports the NIPS data which is suggestive for Trisomy X given that the DMD gene is located on the X chromosome. We discussed that most likely Candyce is not a carrier for DMD, rather the atypical copy number result is probably due to having three X chromosomes. The maternal microarray will help to further clarify this result. If this testing is suggestive of being a carrier, we will discuss this further.  Fragile X Carrier Screening Result. Shamonique screened to not be a carrier for Fragile X syndrome which is an X-linked condition. Specifically, the carrier screening result reports she has 30, 29, and 24 CGG repeats in the FMR1 genes. We discussed that  this result supports the NIPS data which is suggestive  for Trisomy X given that the FMR1 gene is located on the X chromosome. If we confirm that Erin HerterShannon has Trisomy X, then most likely she has 30 CGG repeats on one X chromosome, 29 CGG repeats on the second X chromosome, and 24 CGG repeats on the third X chromosome. There is no risk for Fragile X syndrome in any of Mariabella's biological children given her low CGG repeat numbers (under 45).  Confirmed Carrier for Cystic Fibrosis. Erin HerterShannon is a carrier for the autosomal recessive condition, Cystic Fibrosis (CF). 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 CF is a multisystem disease that involves the respiratory tract, exocrine pancreas, intestine, hepatobiliary system, female genital tract, and exocrine sweat glands. Morbidities include progressive obstructive lung disease with bronchiectasis, frequent hospitalizations for pulmonary disease, pancreatic insufficiency and malnutrition, recurrent sinusitis and bronchitis, and female infertility. The overall median predicted survival is estimated at 40.7 years. CF has not been shown to be associated with intellectual disability. The CF carrier frequency is approximately 1/24 in Ashkenazi Jewish populations, 1/25 in Non-Hispanic Caucasian populations, 1/58 in Hispanic populations, 1/61 in [place] American populations, and 1/94 in [place] populations. Because Erin HerterShannon is a known carrier for CF, carrier screening for Erin LericheMark is recommended to determine risk for the current pregnancy. We reviewed with the couple that if Erin LericheMark is found to also be a carrier for CF, there would be a 25% chance the current pregnancy and all future pregnancies would be affected. Erin LericheMark reported to genetic counseling he has already submitted a sample to the laboratory for screening and results are pending.     Patient Plan:  Proceed with: Maternal blood draw for chromosome five-cell count plus microarray.  Informed consent was obtained. All questions were  answered.  Declined: Amniocentesis. Patient shared with genetic counseling she may consider amniocentesis depending on the results from the testing above.     Thank you [facility].  Please do not hesitate to contact us if you have any questions.  Teena DunkAnalyssa Manish Ruggiero, MS, [facility] Certified Genetic Counselor

## 2022-03-04 DIAGNOSIS — R030 Elevated blood-pressure reading, without diagnosis of hypertension: Secondary | ICD-10-CM | POA: Diagnosis not present

## 2022-03-11 ENCOUNTER — Ambulatory Visit: Payer: Self-pay

## 2022-03-11 ENCOUNTER — Telehealth: Payer: Self-pay | Admitting: Genetics

## 2022-03-11 DIAGNOSIS — Z3A20 20 weeks gestation of pregnancy: Secondary | ICD-10-CM | POA: Diagnosis not present

## 2022-03-11 DIAGNOSIS — R030 Elevated blood-pressure reading, without diagnosis of hypertension: Secondary | ICD-10-CM | POA: Diagnosis not present

## 2022-03-11 NOTE — Telephone Encounter (Signed)
Called Erin May to return maternal karyotype results. Left voicemail with [facility] call back number. ?

## 2022-03-12 ENCOUNTER — Telehealth: Payer: Self-pay | Admitting: Genetics

## 2022-03-12 NOTE — Telephone Encounter (Signed)
Second attempt to contact Shellie to return maternal karyotype results. Left voicemail with [facility] call back number.  ?

## 2022-03-14 ENCOUNTER — Telehealth: Payer: Self-pay | Admitting: Genetics

## 2022-03-14 ENCOUNTER — Encounter: Payer: Self-pay | Admitting: Genetics

## 2022-03-14 DIAGNOSIS — Q970 Karyotype 47, XXX: Secondary | ICD-10-CM | POA: Insufficient documentation

## 2022-03-14 NOTE — Telephone Encounter (Signed)
Contacted Erin May by telephone on 03/14/2022. The following was discussed: ? 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 We offered Erin May the opportunity for a consultation to discuss this result further with a Dentist that specializes in adult genetics at [facility] which she declined. The contact information for the [facility] Counselor is Erin Che "Peggy" Welby, MS, [facility] who can be reached at [phone]. The contact information for this genetic counselor has also been emailed to [employer] at [email or web address] ? ?Erin May's maternal microarray analysis is pending. Genetic counseling will contact Tekeshia when this result is available.  ? ?After hearing the above information, Erin May shared with Genetic Counseling that she does not wish to pursue amniocentesis for prenatal diagnosis in the current pregnancy. ? ?Erin May also shared with Genetic Counseling that her partner, Erin May, screened to not be a carrier for Cystic Fibrosis (this result is not available for genetic counseling to review for confirmation). We discussed that a negative result on carrier screening reduces the likelihood of being a carrier but does not entirely rule out the possibility. We reviewed that each of Erin May's biological children have a 50% chance to be a carrier for Cystic Fibrosis given her carrier status.  ? ?All questions answered.  ?

## 2022-03-14 NOTE — Progress Notes (Signed)
Per patient request, the following e-mail was sent to [facility] ([email or web address]) at 1:31pm on 03/14/2022: ? 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 I've included the contact information for the [facility] Counselor below should you decide you want further genetic counseling regarding this maternal result.  ?Claris Che ?Peggy? Allyson Sabal, MS, CGC ?Telephone: [phone] ?Best,  ?Atwell Mcdanel ? ?Teena Dunk, MS, CGC ?Certified Genetic Counselor ?

## 2022-03-20 DIAGNOSIS — E039 Hypothyroidism, unspecified: Secondary | ICD-10-CM | POA: Diagnosis not present

## 2022-03-21 LAB — BLOOD CHROM 5 CELL CNT + CMA
Cells Analyzed: 20
Cells Counted: 20
Cells Karyotyped: 2
GTG Band Resolution Achieved: 500

## 2022-03-24 ENCOUNTER — Telehealth: Payer: Self-pay | Admitting: Genetics

## 2022-03-24 NOTE — Telephone Encounter (Addendum)
Called Erin May to return maternal microarray results. Left voicemail with [facility] call back number. ?

## 2022-03-25 ENCOUNTER — Telehealth: Payer: Self-pay | Admitting: Genetics

## 2022-03-25 NOTE — Telephone Encounter (Signed)
Second attempt to contact [place] to return maternal microarray results. Left voicemail with [facility] call back number. ? ?

## 2022-03-26 ENCOUNTER — Telehealth: Payer: Self-pay | Admitting: Genetics

## 2022-03-26 NOTE — Telephone Encounter (Signed)
Third attempt to contact Erin May to return maternal microarray results. Left voicemail with [facility] call back number. Tiena viewed the maternal microarray result this morning (03/26/22) at 9:30am.  ?

## 2022-03-27 ENCOUNTER — Encounter: Payer: Self-pay | Admitting: Genetics

## 2022-03-27 ENCOUNTER — Ambulatory Visit: Payer: BC Managed Care – PPO | Attending: Maternal & Fetal Medicine

## 2022-03-27 ENCOUNTER — Ambulatory Visit: Payer: BC Managed Care – PPO | Admitting: *Deleted

## 2022-03-27 ENCOUNTER — Encounter: Payer: Self-pay | Admitting: *Deleted

## 2022-03-27 VITALS — BP 116/74 | HR 76

## 2022-03-27 DIAGNOSIS — E669 Obesity, unspecified: Secondary | ICD-10-CM | POA: Diagnosis not present

## 2022-03-27 DIAGNOSIS — Z141 Cystic fibrosis carrier: Secondary | ICD-10-CM

## 2022-03-27 DIAGNOSIS — O99212 Obesity complicating pregnancy, second trimester: Secondary | ICD-10-CM

## 2022-03-27 DIAGNOSIS — O285 Abnormal chromosomal and genetic finding on antenatal screening of mother: Secondary | ICD-10-CM | POA: Diagnosis not present

## 2022-03-27 DIAGNOSIS — Z6838 Body mass index (BMI) 38.0-38.9, adult: Secondary | ICD-10-CM | POA: Insufficient documentation

## 2022-03-27 DIAGNOSIS — Z3A22 22 weeks gestation of pregnancy: Secondary | ICD-10-CM

## 2022-03-27 DIAGNOSIS — Z363 Encounter for antenatal screening for malformations: Secondary | ICD-10-CM

## 2022-03-27 NOTE — Progress Notes (Signed)
Erin [facility] was seen in the [facility] on 03/27/2022 for a follow-up ultrasound. Genetic counseling reviewed Erin May's maternal microarray result with Erin May and her partner in detail after her ultrasound. 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 The whole genome SNP microarray (Reveal) analysis has identified a female with a dosage gain of a whole chromosome X, consistent with triple X syndrome. This is consistent with the previously discussed maternal chromosome analysis and the reported finding on the NIPS from [facility]. 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 This interval includes 5 OMIM genes (ETAA1, C1D, DNAAF10, PNO1, and  PPP3R1). Genetic counseling reviewed with Erin May that at this time, no clinically established disorders have been reported with duplication of this region, although this could change as studies progress. In general, duplications are clinically tolerated better than deletions, and thus are found more frequently as familial variants. Genetic counseling reviewed that in order to further evaluate clinical relevance, parental analysis would be necessary to determine if the alteration represents a familial variant or a de novo (new) change that is more likely to be clinically significant. We reviewed that the laboratory could complete this follow-up testing and that there would not be a charge associated with it for up to two family members. Erin May stated she is going to speak with her parents about completing the follow-up testing. Genetic counseling instructed Erin May to call the [facility] if her parents want to do the testing so that we can facilitate blood draws. We also reviewed with Erin May that the microarray result further clarified her DMD copy number. Erin May is NOT a carrier for  DMD, rather the atypical result on her [facility] carrier screening is due to her having three X chromosomes. Erin May appeared to understand the information discussed and all questions were answered.  ?

## 2022-04-28 DIAGNOSIS — I158 Other secondary hypertension: Secondary | ICD-10-CM | POA: Diagnosis not present

## 2022-04-28 DIAGNOSIS — Z3A27 27 weeks gestation of pregnancy: Secondary | ICD-10-CM | POA: Diagnosis not present

## 2022-04-28 DIAGNOSIS — O26892 Other specified pregnancy related conditions, second trimester: Secondary | ICD-10-CM | POA: Diagnosis not present

## 2022-05-07 DIAGNOSIS — Z3689 Encounter for other specified antenatal screening: Secondary | ICD-10-CM | POA: Diagnosis not present

## 2022-05-12 DIAGNOSIS — O99810 Abnormal glucose complicating pregnancy: Secondary | ICD-10-CM | POA: Diagnosis not present

## 2022-05-12 DIAGNOSIS — Z3A29 29 weeks gestation of pregnancy: Secondary | ICD-10-CM | POA: Diagnosis not present

## 2022-05-21 ENCOUNTER — Encounter: Payer: BC Managed Care – PPO | Attending: Obstetrics & Gynecology | Admitting: Registered"

## 2022-05-21 ENCOUNTER — Encounter: Payer: Self-pay | Admitting: Registered"

## 2022-05-21 DIAGNOSIS — O24419 Gestational diabetes mellitus in pregnancy, unspecified control: Secondary | ICD-10-CM | POA: Diagnosis not present

## 2022-05-21 NOTE — Progress Notes (Signed)
Patient was seen on 05/21/22 for Gestational Diabetes self-management class at [facility]. The following learning objectives were met by the patient during this course:  States the definition of Gestational Diabetes States why dietary management is important in controlling blood glucose Describes the effects each nutrient has on blood glucose levels Demonstrates ability to create a balanced meal plan Demonstrates carbohydrate counting  States when to check blood glucose levels Demonstrates proper blood glucose monitoring techniques States the effect of stress and exercise on blood glucose levels States the importance of limiting caffeine and abstaining from alcohol and smoking  Blood glucose monitor given: patient has and is checking prior to class  Patient instructed to monitor glucose levels: FBS: 60 - <95; 1 hour: <140; 2 hour: <120  Patient received handouts: Nutrition Diabetes and Pregnancy, including carb counting list  Patient will be seen for follow-up as needed.

## 2022-06-05 ENCOUNTER — Ambulatory Visit: Payer: BC Managed Care – PPO

## 2022-06-06 DIAGNOSIS — Z3A32 32 weeks gestation of pregnancy: Secondary | ICD-10-CM | POA: Diagnosis not present

## 2022-06-06 DIAGNOSIS — O133 Gestational [pregnancy-induced] hypertension without significant proteinuria, third trimester: Secondary | ICD-10-CM | POA: Diagnosis not present

## 2022-06-10 DIAGNOSIS — E039 Hypothyroidism, unspecified: Secondary | ICD-10-CM | POA: Diagnosis not present

## 2022-06-12 DIAGNOSIS — O133 Gestational [pregnancy-induced] hypertension without significant proteinuria, third trimester: Secondary | ICD-10-CM | POA: Diagnosis not present

## 2022-06-18 DIAGNOSIS — Z3A34 34 weeks gestation of pregnancy: Secondary | ICD-10-CM | POA: Diagnosis not present

## 2022-06-18 DIAGNOSIS — O24414 Gestational diabetes mellitus in pregnancy, insulin controlled: Secondary | ICD-10-CM | POA: Diagnosis not present

## 2022-06-19 ENCOUNTER — Encounter (HOSPITAL_COMMUNITY): Payer: Self-pay

## 2022-06-19 ENCOUNTER — Telehealth (HOSPITAL_COMMUNITY): Payer: Self-pay | Admitting: *Deleted

## 2022-06-19 NOTE — Patient Instructions (Addendum)
Erin May  06/19/2022   Your procedure is scheduled on:  07/07/2022  Arrive at 1245 at Entrance C on [address] at [facility]  and [employer]. You are invited to use the FREE valet parking or use the Visitor's parking deck.  Pick up the phone at the desk and dial [phone].  Call this number if you have problems the morning of surgery: [phone]  Remember:   Do not eat food:(After Midnight) Desps de medianoche.  Do not drink clear liquids: (After Midnight) Desps de medianoche.  Take these medicines the morning of surgery with A SIP OF WATER:  Take levothyroxine and nifedipine as prescribed. Take half of the prescribed insulin the night before surgery   Do not wear jewelry, make-up or nail polish.  Do not wear lotions, powders, or perfumes. Do not wear deodorant.  Do not shave 48 hours prior to surgery.  Do not bring valuables to the hospital.  [facility] is not   responsible for any belongings or valuables brought to the hospital.  Contacts, dentures or bridgework may not be worn into surgery.  Leave suitcase in the car. After surgery it may be brought to your room.  For patients admitted to the hospital, checkout time is 11:00 AM the day of              discharge.      Please read over the following fact sheets that you were given:     Preparing for Surgery

## 2022-06-19 NOTE — Telephone Encounter (Signed)
Preadmission screen  

## 2022-06-20 ENCOUNTER — Telehealth (HOSPITAL_COMMUNITY): Payer: Self-pay | Admitting: *Deleted

## 2022-06-20 ENCOUNTER — Encounter (HOSPITAL_COMMUNITY): Payer: Self-pay

## 2022-06-20 NOTE — Telephone Encounter (Signed)
Preadmission screen  

## 2022-06-25 DIAGNOSIS — Z3A35 35 weeks gestation of pregnancy: Secondary | ICD-10-CM | POA: Diagnosis not present

## 2022-06-25 DIAGNOSIS — O133 Gestational [pregnancy-induced] hypertension without significant proteinuria, third trimester: Secondary | ICD-10-CM | POA: Diagnosis not present

## 2022-06-25 DIAGNOSIS — Z3685 Encounter for antenatal screening for Streptococcus B: Secondary | ICD-10-CM | POA: Diagnosis not present

## 2022-06-26 ENCOUNTER — Other Ambulatory Visit: Payer: Self-pay | Admitting: Obstetrics & Gynecology

## 2022-06-26 DIAGNOSIS — M5489 Other dorsalgia: Secondary | ICD-10-CM | POA: Diagnosis not present

## 2022-06-30 DIAGNOSIS — Z3A36 36 weeks gestation of pregnancy: Secondary | ICD-10-CM | POA: Diagnosis not present

## 2022-06-30 DIAGNOSIS — O133 Gestational [pregnancy-induced] hypertension without significant proteinuria, third trimester: Secondary | ICD-10-CM | POA: Diagnosis not present

## 2022-07-03 DIAGNOSIS — Z331 Pregnant state, incidental: Secondary | ICD-10-CM | POA: Diagnosis not present

## 2022-07-03 DIAGNOSIS — E039 Hypothyroidism, unspecified: Secondary | ICD-10-CM | POA: Diagnosis not present

## 2022-07-04 ENCOUNTER — Encounter (HOSPITAL_COMMUNITY)
Admission: RE | Admit: 2022-07-04 | Discharge: 2022-07-04 | Disposition: A | Discharge status: Home / Self Care | Payer: BC Managed Care – PPO | Source: Ambulatory Visit | Attending: Obstetrics & Gynecology | Admitting: Obstetrics & Gynecology

## 2022-07-04 DIAGNOSIS — Z01812 Encounter for preprocedural laboratory examination: Secondary | ICD-10-CM | POA: Insufficient documentation

## 2022-07-04 DIAGNOSIS — O24429 Gestational diabetes mellitus in childbirth, unspecified control: Secondary | ICD-10-CM | POA: Diagnosis not present

## 2022-07-04 DIAGNOSIS — D62 Acute posthemorrhagic anemia: Secondary | ICD-10-CM | POA: Diagnosis not present

## 2022-07-04 DIAGNOSIS — Z141 Cystic fibrosis carrier: Secondary | ICD-10-CM | POA: Diagnosis not present

## 2022-07-04 DIAGNOSIS — O328XX0 Maternal care for other malpresentation of fetus, not applicable or unspecified: Secondary | ICD-10-CM | POA: Diagnosis not present

## 2022-07-04 DIAGNOSIS — Z3A37 37 weeks gestation of pregnancy: Secondary | ICD-10-CM | POA: Diagnosis not present

## 2022-07-04 DIAGNOSIS — O9081 Anemia of the puerperium: Secondary | ICD-10-CM | POA: Diagnosis not present

## 2022-07-04 DIAGNOSIS — O24424 Gestational diabetes mellitus in childbirth, insulin controlled: Secondary | ICD-10-CM | POA: Diagnosis not present

## 2022-07-04 DIAGNOSIS — Q929 Trisomy and partial trisomy of autosomes, unspecified: Secondary | ICD-10-CM | POA: Diagnosis not present

## 2022-07-04 DIAGNOSIS — O134 Gestational [pregnancy-induced] hypertension without significant proteinuria, complicating childbirth: Secondary | ICD-10-CM | POA: Diagnosis not present

## 2022-07-04 DIAGNOSIS — O9912 Other diseases of the blood and blood-forming organs and certain disorders involving the immune mechanism complicating childbirth: Secondary | ICD-10-CM | POA: Diagnosis not present

## 2022-07-04 DIAGNOSIS — O321XX0 Maternal care for breech presentation, not applicable or unspecified: Secondary | ICD-10-CM | POA: Diagnosis not present

## 2022-07-04 DIAGNOSIS — O99893 Other specified diseases and conditions complicating puerperium: Secondary | ICD-10-CM | POA: Diagnosis not present

## 2022-07-04 DIAGNOSIS — O99284 Endocrine, nutritional and metabolic diseases complicating childbirth: Secondary | ICD-10-CM | POA: Diagnosis not present

## 2022-07-04 DIAGNOSIS — O99214 Obesity complicating childbirth: Secondary | ICD-10-CM | POA: Diagnosis not present

## 2022-07-04 DIAGNOSIS — D696 Thrombocytopenia, unspecified: Secondary | ICD-10-CM | POA: Diagnosis not present

## 2022-07-04 DIAGNOSIS — E039 Hypothyroidism, unspecified: Secondary | ICD-10-CM | POA: Diagnosis not present

## 2022-07-04 HISTORY — DX: Gestational (pregnancy-induced) hypertension without significant proteinuria, unspecified trimester: O13.9

## 2022-07-04 HISTORY — DX: Gestational diabetes mellitus in pregnancy, unspecified control: O24.419

## 2022-07-04 LAB — CBC
HCT: 38.7 % (ref 36.0–46.0)
Hemoglobin: 12.5 g/dL (ref 12.0–15.0)
MCH: 29.1 pg (ref 26.0–34.0)
MCHC: 32.3 g/dL (ref 30.0–36.0)
MCV: 90 fL (ref 80.0–100.0)
Platelets: UNDETERMINED 10*3/uL (ref 150–400)
RBC: 4.3 MIL/uL (ref 3.87–5.11)
RDW: 13.2 % (ref 11.5–15.5)
WBC: 9.7 10*3/uL (ref 4.0–10.5)
nRBC: 0.2 % (ref 0.0–0.2)

## 2022-07-04 LAB — TYPE AND SCREEN
ABO/RH(D): AB POS
Antibody Screen: NEGATIVE

## 2022-07-05 LAB — RPR: RPR Ser Ql: NONREACTIVE

## 2022-07-06 NOTE — Anesthesia Preprocedure Evaluation (Signed)
Anesthesia Evaluation  Patient identified by MRN, date of birth, ID band Patient awake    Reviewed: Allergy & Precautions, NPO status , Patient's Chart, lab work & pertinent test results  History of Anesthesia Complications Negative for: history of anesthetic complications  Airway Mallampati: II  TM Distance: >3 FB Neck ROM: Full    Dental no notable dental hx.    Pulmonary neg pulmonary ROS,    Pulmonary exam normal        Cardiovascular hypertension, Normal cardiovascular exam     Neuro/Psych negative neurological ROS  negative psych ROS   GI/Hepatic negative GI ROS, Neg liver ROS,   Endo/Other  diabetes, Gestational, Insulin Dependent, Oral Hypoglycemic AgentsHypothyroidism Morbid obesity  Renal/GU negative Renal ROS  negative genitourinary   Musculoskeletal negative musculoskeletal ROS (+)   Abdominal   Peds  Hematology negative hematology ROS (+)   Anesthesia Other Findings Day of surgery medications reviewed with patient.  Reproductive/Obstetrics (+) Pregnancy (breech presentation, gHTN)                            Anesthesia Physical Anesthesia Plan  ASA: 3  Anesthesia Plan: Spinal   Post-op Pain Management:    Induction:   PONV Risk Score and Plan: 4 or greater and Treatment may vary due to age or medical condition, Ondansetron and Dexamethasone  Airway Management Planned: Natural Airway  Additional Equipment:   Intra-op Plan:   Post-operative Plan:   Informed Consent: I have reviewed the patients History and Physical, chart, labs and discussed the procedure including the risks, benefits and alternatives for the proposed anesthesia with the patient or authorized representative who has indicated his/her understanding and acceptance.       Plan Discussed with: CRNA  Anesthesia Plan Comments:        Anesthesia Quick Evaluation

## 2022-07-06 NOTE — H&P (Incomplete)
Erin May is a 36 y.o. female presenting for delivery at 37.2 wks by C/s for breech. 37 wks delivery for gestational hypertension this pregnancy. She also has GDM G2P0 with h/o ectopic and right salpingectomy. Dating by LMP= sono [facility] 07/26/22  NIPT abn for XXX, but it is of maternal origin and not fetal.  MFM did sono for CF carrier (FOB is not) and DMD carrier (not concerned after counseling).- nl anatomy. [place] f/up w MFM 03/27/22 fo growth.   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 Microarray also reports a variant of uncertain significance on chromosome #2. NIPS low risk for T21, T18, T13. MSAFP negative CF carrier (FOB screen negative) DMD carrier  (FOB neg) pre-G BMI 37.9  Growth at 32 wks - Frank breech, AFI 18 cm, BPP 8 out of 8, EFW 4 lbs. 9 oz., 40th percent, AC 67%, BPD 49% Gest HTN since 20 wks. Off and on high BPs. Didn't tolerate Labetalol. Procardia tolerated well (started in 3rd trim) A2GDM Metformin 500mg  daily , Basaglar 8 units at night for high fastings, BS in range. NST/ BPP weekly - 10/10  Hypothyroidism on Synthroid  Obesity  Persistent Breech- declines version   OB History     Gravida  2   Para      Term      Preterm      AB  1   Living         SAB      IAB      Ectopic  1   Multiple      Live Births             Past Medical History:  Diagnosis Date   Ectopic pregnancy without intrauterine pregnancy 06/26/2021   Gestational diabetes    Hypothyroidism    Pregnancy induced hypertension    Past Surgical History:  Procedure Laterality Date   DIAGNOSTIC LAPAROSCOPY WITH REMOVAL OF ECTOPIC PREGNANCY Right 06/26/2021   Procedure: DIAGNOSTIC LAPAROSCOPY WITH REMOVAL OF ECTOPIC PREGNANCY;  Surgeon: 08/27/2021, MD;  Location: [facility] OR;  Service: Gynecology;  Laterality: Right;   FACIAL FRACTURE SURGERY     pt was 36 yrs old   LAPAROSCOPIC  UNILATERAL SALPINGECTOMY Right 06/26/2021   Procedure: LAPAROSCOPIC RIGHT SALPINGECTOMY;  Surgeon: 08/27/2021, MD;  Location: [facility] OR;  Service: Gynecology;  Laterality: Right;   Family History: family history includes Diabetes in her mother; Hyperlipidemia in her mother; Hypertension in her mother. Social History:  reports that she has never smoked. She has never used smokeless tobacco. She reports that she does not currently use alcohol. She reports that she does not use drugs.     Maternal Diabetes: Yes:  Diabetes Type:  Insulin/Medication controlled Genetic Screening: Abnormal:  Results: Other: NIPT was XXX but on maternal karyotype, mother has XXX  Maternal Ultrasounds/Referrals: Normal  saw MFM for anatomy and 23 wk f/up Growth done in my office, last at 32.6 Wks  Fetal Ultrasounds or other Referrals: MFM- for CF carrier and DMD carrier  Maternal Substance Abuse:  No Significant Maternal Medications:  Procardia 30 XL, Metformin 500 mg, Basaglar 8 units at night, levothyroxine  Significant Maternal Lab Results:  Group B Strep negative Other Comments:  Gest HTN   Review of Systems History   Last menstrual period 10/19/2021, unknown if currently breastfeeding. Exam Physical Exam  Physical exam:  A&O x 3, no acute  distress. Pleasant HEENT neg, no thyromegaly Lungs CTA bilat CV RRR, S1S2 normal Abdo soft, non tender, non acute Gravid , Breech  Extr no edema/ tenderness Pelvic deferred FHT  130s  Toco none   Prenatal labs: ABO, Rh: --/--/AB POS (07/14 1342) Antibody: NEG (07/14 1342) Rubella: Immune (01/09 0000) RPR: NON REACTIVE (07/14 1342)  HBsAg: Negative (01/09 0000)  HIV: Non-reactive (01/09 0000)  GBS:    negtive  3hr Glucola Abn NIPT -female (extra- XXX is maternal)  Assessment/Plan: 36 yo G2P0. At 37 wks delivery for Gestational HTN (procardia 30 XL) and C/section for breech. Pt has well controlled A2GDM, on Metformin and low dose daily insulin Hypothyroid  stable, cont Levothyroxine   Risks/complications of surgery reviewed incl infection, bleeding, damage to internal organs including bladder, bowels, ureters, blood vessels, other risks from anesthesia, VTE and delayed complications of any surgery, complications in future surgery reviewed. Also discussed neonatal complications incl difficult delivery, laceration, vacuum assistance, TTN etc. Pt understands and agrees, all concerns addressed.    Erin May 07/06/2022, 11:21 AM

## 2022-07-07 ENCOUNTER — Other Ambulatory Visit: Payer: Self-pay

## 2022-07-07 ENCOUNTER — Inpatient Hospital Stay (HOSPITAL_COMMUNITY): Payer: BC Managed Care – PPO | Admitting: Anesthesiology

## 2022-07-07 ENCOUNTER — Encounter (HOSPITAL_COMMUNITY)
Admission: RE | Disposition: A | Discharge status: Home / Self Care | Payer: Self-pay | Source: Home / Self Care | Attending: Obstetrics & Gynecology

## 2022-07-07 ENCOUNTER — Inpatient Hospital Stay (HOSPITAL_COMMUNITY)
Admission: RE | Admit: 2022-07-07 | Discharge: 2022-07-10 | DRG: 787 | Disposition: A | Discharge status: Home / Self Care | Payer: BC Managed Care – PPO | Attending: Obstetrics & Gynecology | Admitting: Obstetrics & Gynecology

## 2022-07-07 ENCOUNTER — Encounter (HOSPITAL_COMMUNITY): Payer: Self-pay | Admitting: Obstetrics & Gynecology

## 2022-07-07 DIAGNOSIS — O99284 Endocrine, nutritional and metabolic diseases complicating childbirth: Secondary | ICD-10-CM | POA: Diagnosis present

## 2022-07-07 DIAGNOSIS — Z98891 History of uterine scar from previous surgery: Secondary | ICD-10-CM

## 2022-07-07 DIAGNOSIS — O9912 Other diseases of the blood and blood-forming organs and certain disorders involving the immune mechanism complicating childbirth: Secondary | ICD-10-CM | POA: Diagnosis present

## 2022-07-07 DIAGNOSIS — Z3A37 37 weeks gestation of pregnancy: Secondary | ICD-10-CM

## 2022-07-07 DIAGNOSIS — D696 Thrombocytopenia, unspecified: Secondary | ICD-10-CM | POA: Diagnosis present

## 2022-07-07 DIAGNOSIS — O321XX0 Maternal care for breech presentation, not applicable or unspecified: Secondary | ICD-10-CM | POA: Diagnosis present

## 2022-07-07 DIAGNOSIS — Q929 Trisomy and partial trisomy of autosomes, unspecified: Secondary | ICD-10-CM | POA: Diagnosis not present

## 2022-07-07 DIAGNOSIS — O328XX0 Maternal care for other malpresentation of fetus, not applicable or unspecified: Secondary | ICD-10-CM | POA: Diagnosis present

## 2022-07-07 DIAGNOSIS — O24419 Gestational diabetes mellitus in pregnancy, unspecified control: Secondary | ICD-10-CM

## 2022-07-07 DIAGNOSIS — O9081 Anemia of the puerperium: Secondary | ICD-10-CM | POA: Diagnosis not present

## 2022-07-07 DIAGNOSIS — O99893 Other specified diseases and conditions complicating puerperium: Secondary | ICD-10-CM | POA: Diagnosis present

## 2022-07-07 DIAGNOSIS — O99214 Obesity complicating childbirth: Secondary | ICD-10-CM | POA: Diagnosis present

## 2022-07-07 DIAGNOSIS — O134 Gestational [pregnancy-induced] hypertension without significant proteinuria, complicating childbirth: Principal | ICD-10-CM | POA: Diagnosis present

## 2022-07-07 DIAGNOSIS — Q970 Karyotype 47, XXX: Secondary | ICD-10-CM

## 2022-07-07 DIAGNOSIS — D62 Acute posthemorrhagic anemia: Secondary | ICD-10-CM | POA: Diagnosis not present

## 2022-07-07 DIAGNOSIS — O133 Gestational [pregnancy-induced] hypertension without significant proteinuria, third trimester: Secondary | ICD-10-CM | POA: Diagnosis present

## 2022-07-07 DIAGNOSIS — Z3490 Encounter for supervision of normal pregnancy, unspecified, unspecified trimester: Principal | ICD-10-CM

## 2022-07-07 DIAGNOSIS — Z141 Cystic fibrosis carrier: Secondary | ICD-10-CM

## 2022-07-07 DIAGNOSIS — D509 Iron deficiency anemia, unspecified: Secondary | ICD-10-CM

## 2022-07-07 DIAGNOSIS — O24424 Gestational diabetes mellitus in childbirth, insulin controlled: Secondary | ICD-10-CM | POA: Diagnosis present

## 2022-07-07 DIAGNOSIS — E039 Hypothyroidism, unspecified: Secondary | ICD-10-CM | POA: Diagnosis present

## 2022-07-07 LAB — GLUCOSE, CAPILLARY
Glucose-Capillary: 76 mg/dL (ref 70–99)
Glucose-Capillary: 62 mg/dL — ABNORMAL LOW (ref 70–99)
Glucose-Capillary: 66 mg/dL — ABNORMAL LOW (ref 70–99)
Glucose-Capillary: 73 mg/dL (ref 70–99)

## 2022-07-07 LAB — CBC
HCT: 35.9 % — ABNORMAL LOW (ref 36.0–46.0)
Hemoglobin: 11.7 g/dL — ABNORMAL LOW (ref 12.0–15.0)
MCH: 29.3 pg (ref 26.0–34.0)
MCHC: 32.6 g/dL (ref 30.0–36.0)
MCV: 90 fL (ref 80.0–100.0)
Platelets: 191 10*3/uL (ref 150–400)
RBC: 3.99 MIL/uL (ref 3.87–5.11)
RDW: 13.2 % (ref 11.5–15.5)
WBC: 10.7 10*3/uL — ABNORMAL HIGH (ref 4.0–10.5)
nRBC: 0 % (ref 0.0–0.2)

## 2022-07-07 SURGERY — Surgical Case
Anesthesia: Spinal

## 2022-07-07 MED ORDER — ZOLPIDEM TARTRATE 5 MG PO TABS: 5.0000 mg | ORAL_TABLET | Freq: Every evening | ORAL | Status: DC | PRN

## 2022-07-07 MED ORDER — SENNOSIDES-DOCUSATE SODIUM 8.6-50 MG PO TABS
2.0000 | ORAL_TABLET | Freq: Every day | ORAL | Status: DC
  Administered 2022-07-08 – 2022-07-09 (×2): 2 via ORAL
  Filled 2022-07-07 (×3): qty 2

## 2022-07-07 MED ORDER — OXYCODONE HCL 5 MG PO TABS: 5.0000 mg | ORAL_TABLET | Freq: Four times a day (QID) | ORAL | Status: DC | PRN

## 2022-07-07 MED ORDER — LEVOTHYROXINE SODIUM 50 MCG PO TABS
25.0000 ug | ORAL_TABLET | Freq: Every day | ORAL | Status: DC
  Administered 2022-07-08 – 2022-07-10 (×3): 25 ug via ORAL
  Filled 2022-07-07 (×3): qty 1

## 2022-07-07 MED ORDER — SIMETHICONE 80 MG PO CHEW
80.0000 mg | CHEWABLE_TABLET | Freq: Three times a day (TID) | ORAL | Status: DC
  Administered 2022-07-08 – 2022-07-10 (×6): 80 mg via ORAL
  Filled 2022-07-07 (×6): qty 1

## 2022-07-07 MED ORDER — NIFEDIPINE ER OSMOTIC RELEASE 30 MG PO TB24
30.0000 mg | ORAL_TABLET | Freq: Every day | ORAL | Status: DC
  Administered 2022-07-08: 30 mg via ORAL
  Filled 2022-07-07 (×2): qty 1

## 2022-07-07 MED ORDER — ONDANSETRON HCL 4 MG/2ML IJ SOLN
INTRAMUSCULAR | Status: DC | PRN
  Administered 2022-07-07: 4 mg via INTRAVENOUS

## 2022-07-07 MED ORDER — TRANEXAMIC ACID-NACL 1000-0.7 MG/100ML-% IV SOLN
INTRAVENOUS | Status: AC
  Filled 2022-07-07: qty 100

## 2022-07-07 MED ORDER — MORPHINE SULFATE (PF) 0.5 MG/ML IJ SOLN
INTRAMUSCULAR | Status: AC
  Filled 2022-07-07: qty 10

## 2022-07-07 MED ORDER — TRANEXAMIC ACID-NACL 1000-0.7 MG/100ML-% IV SOLN
INTRAVENOUS | Status: DC | PRN
  Administered 2022-07-07: 1000 mg via INTRAVENOUS

## 2022-07-07 MED ORDER — WITCH HAZEL-GLYCERIN EX PADS: 1.0000 | MEDICATED_PAD | CUTANEOUS | Status: DC | PRN

## 2022-07-07 MED ORDER — DIPHENHYDRAMINE HCL 50 MG/ML IJ SOLN: 12.5000 mg | INTRAMUSCULAR | Status: DC | PRN

## 2022-07-07 MED ORDER — MENTHOL 3 MG MT LOZG: 1.0000 | LOZENGE | OROMUCOSAL | Status: DC | PRN

## 2022-07-07 MED ORDER — DIBUCAINE (PERIANAL) 1 % EX OINT
1.0000 | TOPICAL_OINTMENT | CUTANEOUS | Status: DC | PRN
Start: 2022-07-07 — End: 2022-07-10

## 2022-07-07 MED ORDER — OXYTOCIN-SODIUM CHLORIDE 30-0.9 UT/500ML-% IV SOLN
2.5000 [IU]/h | INTRAVENOUS | Status: AC
  Administered 2022-07-08: 2.5 [IU]/h via INTRAVENOUS
  Filled 2022-07-07: qty 500

## 2022-07-07 MED ORDER — ACETAMINOPHEN 500 MG PO TABS
1000.0000 mg | ORAL_TABLET | Freq: Four times a day (QID) | ORAL | Status: AC
  Administered 2022-07-08 (×3): 1000 mg via ORAL
  Filled 2022-07-07 (×3): qty 2

## 2022-07-07 MED ORDER — TETANUS-DIPHTH-ACELL PERTUSSIS 5-2.5-18.5 LF-MCG/0.5 IM SUSY: 0.5000 mL | PREFILLED_SYRINGE | Freq: Once | INTRAMUSCULAR | Status: DC

## 2022-07-07 MED ORDER — PHENYLEPHRINE HCL-NACL 20-0.9 MG/250ML-% IV SOLN
INTRAVENOUS | Status: DC | PRN
  Administered 2022-07-07: 30 ug/min via INTRAVENOUS

## 2022-07-07 MED ORDER — SODIUM CHLORIDE 0.9% FLUSH: 3.0000 mL | INTRAVENOUS | Status: DC | PRN

## 2022-07-07 MED ORDER — FENTANYL CITRATE (PF) 100 MCG/2ML IJ SOLN
INTRAMUSCULAR | Status: DC | PRN
Start: 2022-07-07 — End: 2022-07-07
  Administered 2022-07-07: 15 ug via INTRATHECAL

## 2022-07-07 MED ORDER — IBUPROFEN 600 MG PO TABS
600.0000 mg | ORAL_TABLET | Freq: Four times a day (QID) | ORAL | Status: DC
  Administered 2022-07-08 – 2022-07-10 (×8): 600 mg via ORAL
  Filled 2022-07-07 (×8): qty 1

## 2022-07-07 MED ORDER — KETOROLAC TROMETHAMINE 30 MG/ML IJ SOLN: 30.0000 mg | Freq: Four times a day (QID) | INTRAMUSCULAR | Status: AC | PRN

## 2022-07-07 MED ORDER — OXYTOCIN-SODIUM CHLORIDE 30-0.9 UT/500ML-% IV SOLN
INTRAVENOUS | Status: DC | PRN
  Administered 2022-07-07: 300 mL via INTRAVENOUS

## 2022-07-07 MED ORDER — KETOROLAC TROMETHAMINE 30 MG/ML IJ SOLN: 30.0000 mg | Freq: Once | INTRAMUSCULAR | Status: DC

## 2022-07-07 MED ORDER — NALOXONE HCL 0.4 MG/ML IJ SOLN: 0.4000 mg | INTRAMUSCULAR | Status: DC | PRN

## 2022-07-07 MED ORDER — ACETAMINOPHEN 325 MG PO TABS
650.0000 mg | ORAL_TABLET | ORAL | Status: DC | PRN
  Administered 2022-07-08 – 2022-07-09 (×3): 650 mg via ORAL
  Filled 2022-07-07 (×3): qty 2

## 2022-07-07 MED ORDER — DROPERIDOL 2.5 MG/ML IJ SOLN: 0.6250 mg | Freq: Once | INTRAMUSCULAR | Status: DC | PRN

## 2022-07-07 MED ORDER — DIPHENHYDRAMINE HCL 25 MG PO CAPS: 25.0000 mg | ORAL_CAPSULE | ORAL | Status: DC | PRN

## 2022-07-07 MED ORDER — ONDANSETRON HCL 4 MG/2ML IJ SOLN: 4.0000 mg | Freq: Three times a day (TID) | INTRAMUSCULAR | Status: DC | PRN

## 2022-07-07 MED ORDER — ONDANSETRON HCL 4 MG/2ML IJ SOLN
INTRAMUSCULAR | Status: AC
  Filled 2022-07-07: qty 2

## 2022-07-07 MED ORDER — POVIDONE-IODINE 10 % EX SWAB
2.0000 | Freq: Once | CUTANEOUS | Status: AC
  Administered 2022-07-07: 2 via TOPICAL

## 2022-07-07 MED ORDER — FENTANYL CITRATE (PF) 100 MCG/2ML IJ SOLN
INTRAMUSCULAR | Status: AC
  Filled 2022-07-07: qty 2

## 2022-07-07 MED ORDER — LACTATED RINGERS IV SOLN: INTRAVENOUS | Status: DC

## 2022-07-07 MED ORDER — CEFAZOLIN SODIUM-DEXTROSE 2-4 GM/100ML-% IV SOLN
INTRAVENOUS | Status: AC
  Filled 2022-07-07: qty 100

## 2022-07-07 MED ORDER — PHENYLEPHRINE 80 MCG/ML (10ML) SYRINGE FOR IV PUSH (FOR BLOOD PRESSURE SUPPORT)
PREFILLED_SYRINGE | INTRAVENOUS | Status: AC
  Filled 2022-07-07: qty 20

## 2022-07-07 MED ORDER — PRENATAL MULTIVITAMIN CH
1.0000 | ORAL_TABLET | Freq: Every day | ORAL | Status: DC
  Administered 2022-07-08 – 2022-07-10 (×3): 1 via ORAL
  Filled 2022-07-07 (×3): qty 1

## 2022-07-07 MED ORDER — MORPHINE SULFATE (PF) 0.5 MG/ML IJ SOLN
INTRAMUSCULAR | Status: DC | PRN
  Administered 2022-07-07: .15 mg via INTRATHECAL

## 2022-07-07 MED ORDER — COCONUT OIL OIL
1.0000 | TOPICAL_OIL | Status: DC | PRN
Start: 2022-07-07 — End: 2022-07-10

## 2022-07-07 MED ORDER — ACETAMINOPHEN 10 MG/ML IV SOLN
INTRAVENOUS | Status: AC
  Filled 2022-07-07: qty 100

## 2022-07-07 MED ORDER — BUPIVACAINE IN DEXTROSE 0.75-8.25 % IT SOLN
INTRATHECAL | Status: DC | PRN
  Administered 2022-07-07: 1.6 mL via INTRATHECAL

## 2022-07-07 MED ORDER — SODIUM CHLORIDE 0.9 % IR SOLN
Status: DC | PRN
  Administered 2022-07-07: 1000 mL

## 2022-07-07 MED ORDER — STERILE WATER FOR IRRIGATION IR SOLN
Status: DC | PRN
  Administered 2022-07-07: 1000 mL

## 2022-07-07 MED ORDER — CEFAZOLIN SODIUM-DEXTROSE 2-4 GM/100ML-% IV SOLN
2.0000 g | INTRAVENOUS | Status: AC
  Administered 2022-07-07: 2 g via INTRAVENOUS

## 2022-07-07 MED ORDER — SIMETHICONE 80 MG PO CHEW
80.0000 mg | CHEWABLE_TABLET | ORAL | Status: DC | PRN
  Administered 2022-07-10: 80 mg via ORAL

## 2022-07-07 MED ORDER — FENTANYL CITRATE (PF) 100 MCG/2ML IJ SOLN: 25.0000 ug | INTRAMUSCULAR | Status: DC | PRN

## 2022-07-07 MED ORDER — OXYTOCIN-SODIUM CHLORIDE 30-0.9 UT/500ML-% IV SOLN
INTRAVENOUS | Status: AC
  Filled 2022-07-07: qty 1000

## 2022-07-07 MED ORDER — DIPHENHYDRAMINE HCL 25 MG PO CAPS: 25.0000 mg | ORAL_CAPSULE | Freq: Four times a day (QID) | ORAL | Status: DC | PRN

## 2022-07-07 MED ORDER — KETOROLAC TROMETHAMINE 30 MG/ML IJ SOLN
30.0000 mg | Freq: Four times a day (QID) | INTRAMUSCULAR | Status: AC
  Administered 2022-07-08 (×3): 30 mg via INTRAVENOUS
  Filled 2022-07-07 (×3): qty 1

## 2022-07-07 MED ORDER — NALOXONE HCL 4 MG/10ML IJ SOLN: 1.0000 ug/kg/h | INTRAVENOUS | Status: DC | PRN

## 2022-07-07 MED ORDER — ACETAMINOPHEN 500 MG PO TABS: 1000.0000 mg | ORAL_TABLET | Freq: Once | ORAL | Status: DC

## 2022-07-07 MED ORDER — ACETAMINOPHEN 10 MG/ML IV SOLN
INTRAVENOUS | Status: DC | PRN
  Administered 2022-07-07: 1000 mg via INTRAVENOUS

## 2022-07-07 MED ORDER — ACETAMINOPHEN 160 MG/5ML PO SOLN: 1000.0000 mg | Freq: Once | ORAL | Status: DC

## 2022-07-07 SURGICAL SUPPLY — 40 items
BENZOIN TINCTURE PRP APPL 2/3 (GAUZE/BANDAGES/DRESSINGS) IMPLANT
CHLORAPREP W/TINT 26ML (MISCELLANEOUS) ×4 IMPLANT
CLAMP CORD UMBIL (MISCELLANEOUS) ×2 IMPLANT
CLOTH BEACON ORANGE TIMEOUT ST (SAFETY) ×2 IMPLANT
DRESSING PREVENA PLUS CUSTOM (GAUZE/BANDAGES/DRESSINGS) IMPLANT
DRSG OPSITE POSTOP 4X10 (GAUZE/BANDAGES/DRESSINGS) ×2 IMPLANT
DRSG PREVENA PLUS CUSTOM (GAUZE/BANDAGES/DRESSINGS) ×2
ELECT REM PT RETURN 9FT ADLT (ELECTROSURGICAL) ×2
ELECTRODE REM PT RTRN 9FT ADLT (ELECTROSURGICAL) ×1 IMPLANT
EXTRACTOR VACUUM KIWI (MISCELLANEOUS) IMPLANT
EXTRACTOR VACUUM M CUP 4 TUBE (SUCTIONS) IMPLANT
GLOVE BIO SURGEON STRL SZ7 (GLOVE) ×2 IMPLANT
GLOVE BIOGEL PI IND STRL 7.0 (GLOVE) ×1 IMPLANT
GLOVE BIOGEL PI INDICATOR 7.0 (GLOVE) ×1
GOWN STRL REUS W/TWL LRG LVL3 (GOWN DISPOSABLE) ×4 IMPLANT
KIT ABG SYR 3ML LUER SLIP (SYRINGE) IMPLANT
NDL HYPO 25X5/8 SAFETYGLIDE (NEEDLE) IMPLANT
NEEDLE HYPO 25X5/8 SAFETYGLIDE (NEEDLE) IMPLANT
NS IRRIG 1000ML POUR BTL (IV SOLUTION) ×2 IMPLANT
PACK C SECTION WH (CUSTOM PROCEDURE TRAY) ×2 IMPLANT
PAD OB MATERNITY 4.3X12.25 (PERSONAL CARE ITEMS) ×2 IMPLANT
RTRCTR C-SECT PINK 25CM LRG (MISCELLANEOUS) IMPLANT
STRIP CLOSURE SKIN 1/2X4 (GAUZE/BANDAGES/DRESSINGS) IMPLANT
SUT MNCRL 0 VIOLET CTX 36 (SUTURE) ×2 IMPLANT
SUT MONOCRYL 0 CTX 36 (SUTURE) ×2
SUT PLAIN 0 NONE (SUTURE) IMPLANT
SUT PLAIN 2 0 (SUTURE)
SUT PLAIN 2 0 XLH (SUTURE) ×1 IMPLANT
SUT PLAIN ABS 2-0 CT1 27XMFL (SUTURE) IMPLANT
SUT VIC AB 0 CT1 27 (SUTURE) ×2
SUT VIC AB 0 CT1 27XBRD ANBCTR (SUTURE) ×2 IMPLANT
SUT VIC AB 2-0 CT1 27 (SUTURE) ×1
SUT VIC AB 2-0 CT1 TAPERPNT 27 (SUTURE) ×1 IMPLANT
SUT VIC AB 2-0 SH 27 (SUTURE) ×2
SUT VIC AB 2-0 SH 27XBRD (SUTURE) ×2 IMPLANT
SUT VIC AB 4-0 KS 27 (SUTURE) ×2 IMPLANT
SUT VICRYL 0 TIES 12 18 (SUTURE) IMPLANT
TOWEL OR 17X24 6PK STRL BLUE (TOWEL DISPOSABLE) ×2 IMPLANT
TRAY FOLEY W/BAG SLVR 14FR LF (SET/KITS/TRAYS/PACK) IMPLANT
WATER STERILE IRR 1000ML POUR (IV SOLUTION) ×2 IMPLANT

## 2022-07-07 NOTE — Transfer of Care (Signed)
Immediate Anesthesia Transfer of Care Note  Patient: Erin May  Procedure(s) Performed: Primary CESAREAN SECTION  Patient Location: PACU  Anesthesia Type:Spinal  Level of Consciousness: awake, alert  and oriented  Airway & Oxygen Therapy: Patient Spontanous Breathing  Post-op Assessment: Report given to RN and Post -op Vital signs reviewed and stable  Post vital signs: Reviewed and stable  Last Vitals:  Vitals Value Taken Time  BP 124/80 07/07/22 1719  Temp 36.6 C 07/07/22 1719  Pulse 86 07/07/22 1722  Resp 15 07/07/22 1722  SpO2 86 % 07/07/22 1722  Vitals shown include unvalidated device data.  Last Pain:  Vitals:   07/07/22 1719  TempSrc: Oral         Complications: No notable events documented.

## 2022-07-07 NOTE — Anesthesia Procedure Notes (Signed)
Spinal  Patient location during procedure: OR Start time: 07/07/2022 3:55 PM End time: 07/07/2022 3:58 PM Reason for block: surgical anesthesia Staffing Performed: anesthesiologist  Anesthesiologist: Kaylyn Layer, MD Performed by: Kaylyn Layer, MD Authorized by: Kaylyn Layer, MD   Preanesthetic Checklist Completed: patient identified, IV checked, risks and benefits discussed, monitors and equipment checked, pre-op evaluation and timeout performed Spinal Block Patient position: sitting Prep: DuraPrep and site prepped and draped Patient monitoring: heart rate, continuous pulse ox and blood pressure Approach: midline Location: L3-4 Injection technique: single-shot Needle Needle type: Pencan  Needle gauge: 24 G Needle length: 10 cm Assessment Sensory level: T4 Events: CSF return Additional Notes Risks, benefits, and alternative discussed. Patient gave consent to procedure. Prepped and draped in sitting position. Clear CSF obtained after one needle redirection. Positive terminal aspiration. No pain or paraesthesias with injection. Patient tolerated procedure well. Vital signs stable. Amalia Greenhouse, MD

## 2022-07-07 NOTE — Lactation Note (Signed)
This note was copied from a baby's chart. Lactation Consultation Note  Patient Name: Erin May [id number] Date: 07/07/2022 Reason for consult: Initial assessment;Mother's request;Primapara;1st time breastfeeding;Early term 37-38.6wks;Maternal endocrine disorder (PIH ( nifedipine)) Age:35 hours  Mom had 2 recent feedings in the room. Last feeding at 1915 for 5 mins Mom states interested in EBF. Mom to call for latch assistance for next feeding.   Plan 1. To feed based on cues 8-12x 24hr period. Mom to offer breasts and look for signs of milk transfer.  2. Mom to supplement with hand expression of EBM via spoon 5- 7 ml per feeding.   Mom to call for latch assistance with next feeding.   All questions answered at the end of the visit.   Maternal Data Has patient been taught Hand Expression?: Yes  Feeding Mother's Current Feeding Choice: Breast Milk  LATCH Score Latch: Repeated attempts needed to sustain latch, nipple held in mouth throughout feeding, stimulation needed to elicit sucking reflex.  Audible Swallowing: A few with stimulation  Type of Nipple: Everted at rest and after stimulation  Comfort (Breast/Nipple): Soft / non-tender  Hold (Positioning): Assistance needed to correctly position infant at breast and maintain latch.  LATCH Score: 7   Lactation Tools Discussed/Used    Interventions Interventions: Breast feeding basics reviewed;Skin to skin;Breast compression;Hand express;Expressed milk;Education;Scientist, research (physical sciences)  Discharge Pump: Personal WIC Program: No  Consult Status Consult Status: Follow-up Date: 07/08/22 Follow-up type: In-patient    Salathiel Ferrara  Nicholson-Springer 07/07/2022, 8:48 PM

## 2022-07-07 NOTE — Anesthesia Postprocedure Evaluation (Signed)
Anesthesia Post Note  Patient: Erin May  Procedure(s) Performed: Primary CESAREAN SECTION     Patient location during evaluation: PACU Anesthesia Type: Spinal Level of consciousness: awake and alert Pain management: pain level controlled Vital Signs Assessment: post-procedure vital signs reviewed and stable Respiratory status: spontaneous breathing, nonlabored ventilation and respiratory function stable Cardiovascular status: blood pressure returned to baseline Postop Assessment: no apparent nausea or vomiting, spinal receding, no headache and no backache Anesthetic complications: no   No notable events documented.  Last Vitals:  Vitals:   07/07/22 1815 07/07/22 1832  BP: 124/77 123/90  Pulse: 70 73  Resp: (!) 23   Temp:  (!) 36.4 C  SpO2: 97%     Last Pain:  Vitals:   07/07/22 1832  TempSrc: Oral  PainSc:    Pain Goal:    LLE Motor Response: Purposeful movement (07/07/22 1815) LLE Sensation: Numbness (07/07/22 1815) RLE Motor Response: Purposeful movement (07/07/22 1815) RLE Sensation: Numbness (07/07/22 1815)     Epidural/Spinal Function Cutaneous sensation: Tingles (07/07/22 1815), Patient able to flex knees: Yes (07/07/22 1815), Patient able to lift hips off bed: No (07/07/22 1815), Back pain beyond tenderness at insertion site: No (07/07/22 1815), Progressively worsening motor and/or sensory loss: No (07/07/22 1815), Bowel and/or bladder incontinence post epidural: No (07/07/22 1815)  Shanda Howells

## 2022-07-07 NOTE — Op Note (Signed)
Cesarean Section Procedure Note   Erin May  07/07/2022  Indications:  Gestational HTN at 37.2 wks. GDM A2, Breech     Pre-operative Diagnosis: Breech Presentation, Gestational Hypertension, A2Gestational Diabetes.   Post-operative Diagnosis: Same   Surgeon: Shea Evans, MD - Primary   Assistants: Dorisann Frames CNM   Anesthesia: spinal   Procedure Details:  The patient was seen in the Holding Room. The risks, benefits, complications, treatment options, and expected outcomes were discussed with the patient. The patient concurred with the proposed plan, giving informed consent. identified as [name] and the procedure verified as C-Section Delivery. Tracksee was placed for gentle pannus retraction.  A Time Out was held and the above information confirmed. 2 gm Ancef given. After induction of anesthesia, the patient was draped and prepped in the usual sterile manner, foley was draining urine well.  A pfannenstiel incision was made and carried down through the subcutaneous tissue to the fascia. Fascial incision was made and extended transversely. The fascia was separated from the underlying rectus tissue superiorly and inferiorly. The peritoneum was identified and entered. Peritoneal incision was extended longitudinally. Alexis-O retractor placed. The utero-vesical peritoneal reflection was incised with low transverse hysterotomy. Incision extended laterally curving up. Large sinuses started brisk bleeding. Allis x 2 placed. Amniotic fluid was copious and clear. Baby in Incomplete breech presentation with right foot up by the head and buttocks and left foot in lower segment. Baby delivered by complete breech extractions steps, keeping back anterior and head flexed.  Female infant with vigorous cry delivered at 16.32 hours . Apgar scores of 8 at one minute and 9 at five minutes. Delayed cord clamping done at 1 minute and baby handed to NICU team in attendance. Cord ph was not sent.  Cord blood was obtained for evaluation. The placenta was removed Intact and appeared normal. The uterine outline, tubes and ovaries appeared normal}.All bleeding sinuses were clamped with Rings as soon as baby delivered.  The uterine incision was closed with running locked sutures of 0 Monocryl . A second imbricating layer sutured.   Hemostasis was observed. Alexis retractor removed. Peritoneal closure done with 2-0 Vicryl.  The fascia was then reapproximated with running sutures of 0Vicryl. The subcuticular closure was performed using 2-0plain gut. The skin was closed with 4-0Vicryl. Steristrips and Pravena placed.  All counts were correct x 2.  Findings: Girl infant delivered from [facility] hysterotomy by complete breech extraction steps without complications. Apgars 8 and 9. Large sinuses in lower segment with brisk bleeding at hysterotomy with EBL 1300 cc. TXA given as bleeding was noted from uterine sinuses at hysterotomy.    Estimated Blood Loss:  1300 cc   Total IV Fluids: 1200 ml LR   Urine Output:  200CC OF clear urine  Specimens: cord blood    Complications: no complications  Disposition: PACU - hemodynamically stable.   Maternal Condition: stable   Baby condition / location:  Couplet care / Skin to Skin  Attending Attestation: I performed the procedure.   Signed: Surgeon(s): Shea Evans, MD

## 2022-07-07 NOTE — Progress Notes (Signed)
Hypoglycemic Event  CBG: 62  Treatment: 4 oz juice/soda  Symptoms: Pale  Follow-up CBG: Time:1745 CBG Result:66  Possible Reasons for Event: Inadequate meal intake and Other: surgical operation, NPO  Comments/MD notified:OB Anesthesiologist Dr Stephannie Peters notified. Start with juice if pt not nauseas  Hypoglycemic Event  CBG: 66  Treatment: 8 oz juice/soda  Symptoms: None  Follow-up CBG: Time:1805 CBG Result:73  Possible Reasons for Event: Inadequate meal intake and Other: surgical operation  Comments/MD notified:OB anesthesiologist, Dr. Stephannie Peters aware    Erin May

## 2022-07-08 LAB — CBC
HCT: 27.4 % — ABNORMAL LOW (ref 36.0–46.0)
Hemoglobin: 8.9 g/dL — ABNORMAL LOW (ref 12.0–15.0)
MCH: 29.2 pg (ref 26.0–34.0)
MCHC: 32.5 g/dL (ref 30.0–36.0)
MCV: 89.8 fL (ref 80.0–100.0)
Platelets: 129 10*3/uL — ABNORMAL LOW (ref 150–400)
RBC: 3.05 MIL/uL — ABNORMAL LOW (ref 3.87–5.11)
RDW: 13.2 % (ref 11.5–15.5)
WBC: 10.6 10*3/uL — ABNORMAL HIGH (ref 4.0–10.5)
nRBC: 0 % (ref 0.0–0.2)

## 2022-07-08 LAB — GLUCOSE, CAPILLARY: Glucose-Capillary: 81 mg/dL (ref 70–99)

## 2022-07-08 MED ORDER — MISOPROSTOL 200 MCG PO TABS
800.0000 ug | ORAL_TABLET | Freq: Once | ORAL | Status: AC
  Administered 2022-07-08: 800 ug via RECTAL
  Filled 2022-07-08: qty 4

## 2022-07-08 MED ORDER — MAGNESIUM OXIDE -MG SUPPLEMENT 400 (240 MG) MG PO TABS
400.0000 mg | ORAL_TABLET | Freq: Every day | ORAL | Status: DC
  Administered 2022-07-08 – 2022-07-10 (×3): 400 mg via ORAL
  Filled 2022-07-08 (×3): qty 1

## 2022-07-08 MED ORDER — NIFEDIPINE ER OSMOTIC RELEASE 30 MG PO TB24
30.0000 mg | ORAL_TABLET | Freq: Two times a day (BID) | ORAL | Status: DC
  Administered 2022-07-08 – 2022-07-10 (×4): 30 mg via ORAL
  Filled 2022-07-08 (×3): qty 1

## 2022-07-08 MED ORDER — POLYSACCHARIDE IRON COMPLEX 150 MG PO CAPS
150.0000 mg | ORAL_CAPSULE | Freq: Every day | ORAL | Status: DC
  Administered 2022-07-08 – 2022-07-10 (×3): 150 mg via ORAL
  Filled 2022-07-08 (×3): qty 1

## 2022-07-08 NOTE — Progress Notes (Signed)
Subjective: G1P1001  Postpartum Day 1: Cesarean Delivery- at 37 wks for Gest HTN. Breech LTCS . Girl, [place], [phone]"  Breast feeding   Patient reports feeling well. Lochia moderate. NO dizziness/ CP/ SOB. Incision pain well controlled. Tolerated regular diet and ambulated, voided.     Objective: Vital signs in last 24 hours: BP 126/80 (BP Location: Right Arm)   Pulse 88   Temp 98.2 F (36.8 C) (Oral)   Resp 18   Wt 115.7 kg   LMP 10/19/2021   SpO2 100%   Breastfeeding Unknown   BMI 43.77 kg/m   Patient Vitals for the past 24 hrs:  BP Temp Temp src Pulse Resp SpO2 Weight  07/08/22 1035 126/80 -- Oral 88 18 100 % --  07/08/22 0627 124/84 98.2 F (36.8 C) Oral 78 16 100 % --  07/08/22 0321 125/75 98.6 F (37 C) Oral 86 16 98 % --  07/08/22 0125 133/79 -- -- 83 -- -- --  07/07/22 2352 139/89 98.2 F (36.8 C) Oral 73 18 99 % --  07/07/22 2123 132/88 98.3 F (36.8 C) Oral 86 18 100 % --  07/07/22 1950 (!) 133/91 98.1 F (36.7 C) Oral 85 18 100 % --  07/07/22 1945 -- -- -- -- -- -- 115.7 kg  07/07/22 1832 123/90 (!) 97.5 F (36.4 C) Oral 73 -- -- --  07/07/22 1815 124/77 -- -- 70 (!) 23 97 % --  07/07/22 1800 134/83 -- -- 75 17 99 % --  07/07/22 1745 130/77 -- -- 80 15 100 % --  07/07/22 1730 127/89 -- -- 85 15 97 % --  07/07/22 1719 124/80 97.8 F (36.6 C) Oral 84 16 100 % --     Physical Exam:  General: alert and cooperative Lungs CTA CV RRR Abdo NABS, incision dry with Pravena  Lochia: appropriate Uterine Fundus: firm DVT Evaluation: No evidence of DVT seen on physical exam. Negative Homan's sign.     Latest Ref Rng & Units 07/08/2022    5:22 AM 07/07/2022    1:31 PM 07/04/2022    1:42 PM  CBC  WBC 4.0 - 10.5 K/uL 10.6  10.7  9.7   Hemoglobin 12.0 - 15.0 g/dL 8.9  33.8  25.0   Hematocrit 36.0 - 46.0 % 27.4  35.9  38.7   Platelets 150 - 400 K/uL 129  191  PLATELET CLUMPS NOTED ON SMEAR, UNABLE TO ESTIMATE        Latest Ref Rng & Units 06/26/2021   12:45 PM   CMP  Glucose 70 - 99 mg/dL 539   BUN 6 - 20 mg/dL 11   Creatinine 7.67 - 1.00 mg/dL 3.41   Sodium 937 - 902 mmol/L 136   Potassium 3.5 - 5.1 mmol/L 4.2   Chloride 98 - 111 mmol/L 105   CO2 22 - 32 mmol/L 24   Calcium 8.9 - 10.3 mg/dL 9.2   Total Protein 6.5 - 8.1 g/dL 7.0   Total Bilirubin 0.3 - 1.2 mg/dL 0.8   Alkaline Phos 38 - 126 U/L 49   AST 15 - 41 U/L 22   ALT 0 - 44 U/L 19    AB + Rub Imm   Assessment/Plan: Status post Cesarean section. POD #1 Doing well postoperatively.  Continue current care. GHTN- contin Procardia 30 XL A2GDM - stop meds and stop testing since BS wnl  Hypothyroid - continue Synthroid  Post op- care and PP care dw pt  ABL anemia- Iron/ Mag-ox/  Low platelets - repeat CBC and add CMP tomorrow    Robley Fries, MD 07/08/2022, 1:20 PM

## 2022-07-08 NOTE — Lactation Note (Addendum)
This note was copied from a baby's chart. Lactation Consultation Note  Patient Name: Erin May [id number] Date: 07/08/2022 Reason for consult: Follow-up assessment;Mother's request;Difficult latch;Early term 37-38.6wks;Breastfeeding assistance Age:35 hours  Infant long periods not able to latch. Parents aware, place infant STS, hand express offer colostrum, then try a latch.   Infant adequate urine and stool output.   Infant latched with increase in depth of swallows with breast compression. Best feeding positions football infant semi sitting up.  Plan 1. To feed based on cues 8-12x 24hr period. Mom to offer breasts and look for signs of milk transfer.  2. If unable to latch, Mom to hand express and offer colostrum then try a latch.   All questions answered at the end of the visit.   Maternal Data Has patient been taught Hand Expression?: Yes  Feeding Mother's Current Feeding Choice: Breast Milk  LATCH Score Latch: Repeated attempts needed to sustain latch, nipple held in mouth throughout feeding, stimulation needed to elicit sucking reflex.  Audible Swallowing: Spontaneous and intermittent  Type of Nipple: Everted at rest and after stimulation  Comfort (Breast/Nipple): Soft / non-tender  Hold (Positioning): Assistance needed to correctly position infant at breast and maintain latch.  LATCH Score: 8   Lactation Tools Discussed/Used    Interventions Interventions: Breast feeding basics reviewed;Assisted with latch;Skin to skin;Breast massage;Hand express;Breast compression;Adjust position;Support pillows;Position options;Expressed milk;Education;LC Psychologist, educational;Infant Driven Feeding Algorithm education  Discharge Pump: Personal  Consult Status Consult Status: Follow-up Date: 07/09/22 Follow-up type: In-patient    Crews Mccollam  Nicholson-Springer 07/08/2022, 4:06 PM

## 2022-07-09 DIAGNOSIS — D509 Iron deficiency anemia, unspecified: Secondary | ICD-10-CM

## 2022-07-09 DIAGNOSIS — O24419 Gestational diabetes mellitus in pregnancy, unspecified control: Secondary | ICD-10-CM

## 2022-07-09 LAB — COMPREHENSIVE METABOLIC PANEL
ALT: 12 U/L (ref 0–44)
AST: 22 U/L (ref 15–41)
Albumin: 2.4 g/dL — ABNORMAL LOW (ref 3.5–5.0)
Alkaline Phosphatase: 80 U/L (ref 38–126)
Anion gap: 7 (ref 5–15)
BUN: 9 mg/dL (ref 6–20)
CO2: 24 mmol/L (ref 22–32)
Calcium: 9.1 mg/dL (ref 8.9–10.3)
Chloride: 107 mmol/L (ref 98–111)
Creatinine, Ser: 0.72 mg/dL (ref 0.44–1.00)
GFR, Estimated: >60 mL/min (ref >60)
Glucose, Bld: 101 mg/dL — ABNORMAL HIGH (ref 70–99)
Potassium: 4 mmol/L (ref 3.5–5.1)
Sodium: 138 mmol/L (ref 135–145)
Total Bilirubin: 0.4 mg/dL (ref 0.3–1.2)
Total Protein: 5.3 g/dL — ABNORMAL LOW (ref 6.5–8.1)

## 2022-07-09 LAB — CBC
HCT: 28.8 % — ABNORMAL LOW (ref 36.0–46.0)
Hemoglobin: 9.4 g/dL — ABNORMAL LOW (ref 12.0–15.0)
MCH: 29.2 pg (ref 26.0–34.0)
MCHC: 32.6 g/dL (ref 30.0–36.0)
MCV: 89.4 fL (ref 80.0–100.0)
Platelets: 144 10*3/uL — ABNORMAL LOW (ref 150–400)
RBC: 3.22 MIL/uL — ABNORMAL LOW (ref 3.87–5.11)
RDW: 13.5 % (ref 11.5–15.5)
WBC: 11.2 10*3/uL — ABNORMAL HIGH (ref 4.0–10.5)
nRBC: 0 % (ref 0.0–0.2)

## 2022-07-09 LAB — BIRTH TISSUE RECOVERY COLLECTION (PLACENTA DONATION)

## 2022-07-09 NOTE — Progress Notes (Addendum)
   Subjective: POD# 2 Live born female  Birth Weight: 6 lb 11.2 oz (3040 g) APGAR: 8, 9  Newborn Delivery   Birth date/time: 07/[birth year]/2022 16:32:00 Delivery type: C-Section, Low Transverse Trial of labor: No C-section categorization: Primary     Baby name: Magdalene Patricia  Delivering provider: MODY, VAISHALI   Feeding: breast and pumping   Pain control at delivery: Spinal   Reports feeling well   Patient reports tolerating PO.   Breast symptoms none  Pain controlled with acetaminophen and ibuprofen (OTC) Denies HA/SOB/C/P/N/V/dizziness. Flatus + . She reports vaginal bleeding as normal, without clots.  She is ambulating, urinating without difficulty.      Objective:   VS:    Vitals:   07/08/22 2115 07/08/22 2242 07/09/22 0559 07/09/22 1011  BP: (!) 145/92 140/84 (!) 138/93 134/90  Pulse: 79 81 84 89  Resp: 18 16 18    Temp: 98.5 F (36.9 C) 98.2 F (36.8 C) 98.2 F (36.8 C)   TempSrc: Oral Oral Oral   SpO2:  100%    Weight:         Intake/Output Summary (Last 24 hours) at 07/09/2022 1156 Last data filed at 07/08/2022 1530 Gross per 24 hour  Intake --  Output 1200 ml  Net -1200 ml         Recent Labs    07/08/22 0522 07/09/22 0519  WBC 10.6* 11.2*  HGB 8.9* 9.4*  HCT 27.4* 28.8*  PLT 129* 144*      Blood type: --/--/AB POS (07/14 1342)  Rubella: Immune (01/09 0000)  Vaccines: TDaP          up to date     Physical Exam:  General: alert and cooperative Abdomen: soft, appropriately tender Incision:  Pravena negative pressure dressing in place  Uterine Fundus: unable to palpate due to habitus  Ext: edema +1 pitting pretibial edema, no evidence of DVT  Assessment/Plan: 36 y.o.   POD# 2. 20                  Principal Problem:   Postpartum care following cesarean delivery 7/17 Active Problems:   Trisomy X syndrome   Breech presentation   Gestational hypertension w/o significant proteinuria in 3rd trimester - Procardia XL 30 mg PO BID   - BP  labile to mild range, no neural sx  - will continue Procardia to 30 XL BID, continue to monitor closely. Consider increase procardia 60 mg at HS if persistent elevated BP > 140/90   Status post primary low transverse cesarean section 7/17   GDM, class A2 - delivered   - stable CBG, no further monitoring, will F/U outpatient   Anemia - asymptomatic - started PO Iron and Mag oxide   Hypothyroidism  - continue synthroid as established Low platelets  - no sx of hemorrhage  - rpt CBC today, plts stable 129   Doing well, stable.    TED hose and abdominal binder for comfort          Advance diet as tolerated Encourage rest when baby rests Breastfeeding support Encourage to ambulate Routine post-op care Anticipate DC in AM  8/17, CNM 07/09/2022, 11:56 AM

## 2022-07-09 NOTE — Lactation Note (Signed)
This note was copied from a baby's chart. Lactation Consultation Note  Patient Name: Erin May [id number] Date: 07/09/2022 Reason for consult: Follow-up assessment;Early term 37-38.6wks;Primapara;1st time breastfeeding Age:35 hours   P1 mother whose infant is now 49 hours old.  This is an early term infant at 37+2 weeks weighing > 6 lbs.  Baby has a 9% weight loss this morning.    Baby "Erin May" was awake when I arrived.  Offered to assist with latching; mother receptive.  She reported that "Erin May" has not been breast feeding for long durations.  Reviewed hand expression and finger fed colostrum drops.  Assisted to latch easily in the football hold to the right breast.  Observed her feeding for 10 minutes.  Reminded parents to gently stimulate when she tires.    Suggested parents begin supplementation due to weight loss.  Parents in agreement.  They prefer formula for supplementation. Discussed and demonstrated paced bottle feeding.  Father fed 25 mls using the slow flow yellow nipple; baby burped well.  Initiated the DEBP for mother: #24 flanges are appropriate at this time.  Taught mother how to assess for correct flange size with every pumping session.  Reviewed pump parts, set up and cleaning.  Wash station provided.  Encouraged mother to call for any latch assistance needed or for any questions/concerns.  Father is an active participant in baby's care and willing to assist as needed.  RN updated.   Maternal Data Has patient been taught Hand Expression?: Yes Does the patient have breastfeeding experience prior to this delivery?: No  Feeding Mother's Current Feeding Choice: Breast Milk and Formula Nipple Type: Slow - flow  LATCH Score Latch: Repeated attempts needed to sustain latch, nipple held in mouth throughout feeding, stimulation needed to elicit sucking reflex.  Audible Swallowing: A few with stimulation  Type of Nipple: Everted at rest and after  stimulation  Comfort (Breast/Nipple): Soft / non-tender  Hold (Positioning): Assistance needed to correctly position infant at breast and maintain latch.  LATCH Score: 7   Lactation Tools Discussed/Used    Interventions Interventions: Breast feeding basics reviewed;Assisted with latch;Skin to skin;Breast massage;Hand express;Breast compression;Position options;Support pillows;Adjust position;DEBP;Education;Pace feeding  Discharge Pump: Personal (Motif)  Consult Status Consult Status: Follow-up Date: 07/10/22 Follow-up type: In-patient    Charlita Brian R Keyunna Coco 07/09/2022, 7:10 AM

## 2022-07-10 MED ORDER — SIMETHICONE 80 MG PO CHEW: 80.0000 mg | CHEWABLE_TABLET | ORAL | 0 refills | Status: AC | PRN

## 2022-07-10 MED ORDER — POLYSACCHARIDE IRON COMPLEX 150 MG PO CAPS: 150.0000 mg | ORAL_CAPSULE | Freq: Every day | ORAL | Status: AC

## 2022-07-10 MED ORDER — COCONUT OIL OIL: 1.0000 | TOPICAL_OIL | 0 refills | Status: AC | PRN

## 2022-07-10 MED ORDER — MAGNESIUM OXIDE -MG SUPPLEMENT 400 (240 MG) MG PO TABS: 400.0000 mg | ORAL_TABLET | Freq: Every day | ORAL | Status: AC

## 2022-07-10 MED ORDER — ACETAMINOPHEN 500 MG PO TABS: 1000.0000 mg | ORAL_TABLET | Freq: Four times a day (QID) | ORAL | 2 refills | Status: AC | PRN

## 2022-07-10 MED ORDER — OXYCODONE HCL 5 MG PO TABS: 5.0000 mg | ORAL_TABLET | Freq: Four times a day (QID) | ORAL | 0 refills | Status: AC | PRN

## 2022-07-10 MED ORDER — SENNOSIDES-DOCUSATE SODIUM 8.6-50 MG PO TABS: 2.0000 | ORAL_TABLET | Freq: Every day | ORAL | Status: AC

## 2022-07-10 MED ORDER — IBUPROFEN 600 MG PO TABS: 600.0000 mg | ORAL_TABLET | Freq: Four times a day (QID) | ORAL | 0 refills | Status: AC

## 2022-07-10 MED ORDER — NIFEDIPINE ER 30 MG PO TB24: 30.0000 mg | ORAL_TABLET | Freq: Two times a day (BID) | ORAL | 0 refills | Status: AC

## 2022-07-10 NOTE — Discharge Instructions (Signed)
Lactation outpatient support - home visit ° ° °Jessica Bowers, IBCLC (lactation consultant)  & Birth Doula ° °Phone (text or call): [phone] °Email: [email or web address] °[email or web address] ° ° °Linda Coppola °RN, MHA, IBCLC °at [facility]: Lactation Consultant ° °[email or web address] °Mail: [email or web address] °Tel: [phone] ° ° °Additional breastfeeding resources: ° °[facility] °https://ibconline.ca/information-sheets/ ° °[employer] [place] ° °www.lllofnc.org ° ° °Other Resources: ° °Chiropractic specialist  ° °Dr. Leanna Hastings °[email or web address] ° ° °Craniosacral therapy for baby ° °Erin Balkind  °[email or web address] ° °

## 2022-07-10 NOTE — Discharge Summary (Signed)
OB Discharge Summary  Patient Name: Erin May DOB: 02/26/1986 MRN: [id number]  Date of admission: 07/07/2022 Delivering provider: MODY, VAISHALI   Admitting diagnosis: Breech presentation [O32.1XX0] Status post primary low transverse cesarean section [Z98.891] Intrauterine pregnancy: [redacted]w[redacted]d     Secondary diagnosis: Patient Active Problem List   Diagnosis Date Noted   GDM, class A2 - delivered 07/09/2022   Iron deficiency anemia 07/09/2022   Breech presentation 07/07/2022   Gestational hypertension w/o significant proteinuria in 3rd trimester 07/07/2022   Status post primary low transverse cesarean section 7/17 07/07/2022   Postpartum care following cesarean delivery 7/17 07/07/2022   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   Additional problems:none   Date of discharge: 07/10/2022   Discharge diagnosis: Principal Problem:   Postpartum care following cesarean delivery 7/17 Active Problems:   Trisomy X syndrome   Breech presentation   Gestational hypertension w/o significant proteinuria in 3rd trimester   Status post primary low transverse cesarean section 7/17   GDM, class A2 - delivered   Iron deficiency anemia                                                              Post partum procedures: none  Augmentation: N/A Pain control: Spinal  Laceration:None  Episiotomy:None  Complications: None  Hospital course:  Sceduled C/S   36 y.o. yo G2P1011 at [redacted]w[redacted]d was admitted to the hospital 07/07/2022 for scheduled cesarean section with the following indication:Malpresentation and gestational hypertension .Delivery details are as follows:  Membrane Rupture Time/Date: 4:32 PM ,07/07/2022   Delivery Method:C-Section, Low Transverse  Details of operation can be found in separate operative note.  Patient had an uncomplicated postpartum course.  She continued on Procardia 30XL increased to twice daily postpartum Blood pressure remained labile to mild  range, no preeclamptic features noted. She is ambulating, tolerating a regular diet, passing flatus, and urinating well. Patient is discharged home in stable condition on  07/10/22 for close follow up in office next week, blood pressure check and Prevena dressing removal.        Newborn Data: Birth date:07/07/2022  Birth time:4:32 PM  Gender:Female  Living status:Living  Apgars:8 ,9  Weight:3040 g     Physical exam  Vitals:   07/09/22 1523 07/09/22 2014 07/09/22 2252 07/10/22 0500  BP: (!) 142/89 136/86 129/84 121/69  Pulse: 86 86  93  Resp: 16 18    Temp: 98.1 F (36.7 C) 98.1 F (36.7 C)  98.5 F (36.9 C)  TempSrc: Oral Oral  Oral  SpO2: 100% 100%  100%  Weight:       General: alert, cooperative, and no distress Lochia: appropriate Uterine Fundus: firm Incision: Healing well with no significant drainage, Prevena dressing in place and patent DVT Evaluation: No cords or calf tenderness. Calf/Ankle edema is present Labs: Lab Results  Component Value Date   WBC 11.2 (H) 07/09/2022   HGB 9.4 (L) 07/09/2022   HCT 28.8 (L) 07/09/2022   MCV 89.4 07/09/2022   PLT 144 (L) 07/09/2022      Latest Ref Rng & Units 07/09/2022    5:19 AM  CMP  Glucose 70 - 99 mg/dL 921   BUN 6 - 20 mg/dL 9   Creatinine 1.94 - 1.74 mg/dL 0.81  Sodium 135 - 145 mmol/L 138   Potassium 3.5 - 5.1 mmol/L 4.0   Chloride 98 - 111 mmol/L 107   CO2 22 - 32 mmol/L 24   Calcium 8.9 - 10.3 mg/dL 9.1   Total Protein 6.5 - 8.1 g/dL 5.3   Total Bilirubin 0.3 - 1.2 mg/dL 0.4   Alkaline Phos 38 - 126 U/L 80   AST 15 - 41 U/L 22   ALT 0 - 44 U/L 12       07/09/2022    8:14 PM  Edinburgh Postnatal Depression Scale Screening Tool  I have been able to laugh and see the funny side of things. 0  I have looked forward with enjoyment to things. 0  I have blamed myself unnecessarily when things went wrong. 1  I have been anxious or worried for no good reason. 0  I have felt scared or panicky for no good reason.  0  Things have been getting on top of me. 0  I have been so unhappy that I have had difficulty sleeping. 0  I have felt sad or miserable. 0  I have been so unhappy that I have been crying. 0  The thought of harming myself has occurred to me. 0  Edinburgh Postnatal Depression Scale Total 1   Vaccines: TDaP          UTD   Discharge instruction:  per After Visit Summary,  [facility] OB booklet and  "Understanding Mother & Baby Care" hospital booklet  After Visit Meds:  Allergies as of 07/10/2022   No Known Allergies      Medication List     TAKE these medications    acetaminophen 500 MG tablet Commonly known as: TYLENOL Take 2 tablets (1,000 mg total) by mouth every 6 (six) hours as needed.   cholecalciferol 25 MCG (1000 UNIT) tablet Commonly known as: VITAMIN D3 Take 1,000 Units by mouth daily.   coconut oil Oil Apply 1 Application topically as needed.   ibuprofen 600 MG tablet Commonly known as: ADVIL Take 1 tablet (600 mg total) by mouth every 6 (six) hours.   iron polysaccharides 150 MG capsule Commonly known as: Ferrex 150 Take 1 capsule (150 mg total) by mouth daily.   levothyroxine 25 MCG tablet Commonly known as: SYNTHROID Take 25 mcg by mouth daily before breakfast.   magnesium oxide 400 (240 Mg) MG tablet Commonly known as: MAG-OX Take 1 tablet (400 mg total) by mouth daily. For prevention of constipation.   NIFEdipine 30 MG 24 hr tablet Commonly known as: ADALAT CC Take 1 tablet (30 mg total) by mouth 2 (two) times daily. What changed: when to take this   oxyCODONE 5 MG immediate release tablet Commonly known as: Oxy IR/ROXICODONE Take 1 tablet (5 mg total) by mouth every 6 (six) hours as needed for up to 5 days for moderate pain.   prenatal multivitamin Tabs tablet Take 1 tablet by mouth daily.   senna-docusate 8.6-50 MG tablet Commonly known as: Senokot-S Take 2 tablets by mouth daily.   simethicone 80 MG chewable tablet Commonly known as:  MYLICON Chew 1 tablet (80 mg total) by mouth as needed for flatulence.        Diet: routine diet  Activity: Advance as tolerated. Pelvic rest for 6 weeks.   Postpartum contraception: TBA in office  Newborn Data: Live born female  Birth Weight: 6 lb 11.2 oz (3040 g) APGAR: 8, 9  Newborn Delivery   Birth date/time: 07/07/2022 16:32:00  Delivery type: C-Section, Low Transverse Trial of labor: No C-section categorization: Primary      named Magnolia Jane Baby Feeding: Bottle and Breast Disposition:home with mother  Delivery Report:  Review the Delivery Report for details.    Follow up:  Follow-up Information     Shea Evans, MD. Schedule an appointment as soon as possible for a visit on 07/14/2022.   Specialty: Obstetrics and Gynecology Why: For Postpartum follow-up - BP check and Provena removal Contact information: [address] [place] [address] [phone]                   Signed: Neta Mends, CNM, MSN 07/10/2022, 8:41 AM

## 2022-07-10 NOTE — Lactation Note (Signed)
This note was copied from a baby's chart. Lactation Consultation Note  Patient Name: Erin May RJJOA'C Date: 07/10/2022 Reason for consult: Follow-up assessment;Primapara;1st time breastfeeding;Infant weight loss;Other (Comment);Early term 37-38.6wks (9 % weight loss) Age:35 hours LC reviewed BF D/C teaching. Mom aware of LC resources after D/C.  Maternal Data    Feeding Mother's Current Feeding Choice: Breast Milk and Formula  LATCH Score                    Lactation Tools Discussed/Used    Interventions Interventions: Breast feeding basics reviewed;Education  Discharge Discharge Education: Engorgement and breast care;Warning signs for feeding baby;Other (comment) (mom has her own LC from [facility] /[facility]) Pump: DEBP;Manual;Personal  Consult Status Consult Status: Complete Date: 07/10/22    Kathrin Greathouse 07/10/2022, 1:34 PM

## 2022-07-14 DIAGNOSIS — R300 Dysuria: Secondary | ICD-10-CM | POA: Diagnosis not present

## 2022-07-14 DIAGNOSIS — O139 Gestational [pregnancy-induced] hypertension without significant proteinuria, unspecified trimester: Secondary | ICD-10-CM | POA: Diagnosis not present

## 2022-07-15 ENCOUNTER — Telehealth (HOSPITAL_COMMUNITY): Payer: Self-pay | Admitting: *Deleted

## 2022-07-15 NOTE — Telephone Encounter (Signed)
Attempted hospital discharge follow-up call. Left message for patient to return RN call with any questions or concerns. Deforest Hoyles, RN, 07/15/22, [phone]

## 2022-07-28 DIAGNOSIS — R399 Unspecified symptoms and signs involving the genitourinary system: Secondary | ICD-10-CM | POA: Diagnosis not present

## 2022-08-06 DIAGNOSIS — R238 Other skin changes: Secondary | ICD-10-CM | POA: Diagnosis not present

## 2022-08-19 DIAGNOSIS — Z309 Encounter for contraceptive management, unspecified: Secondary | ICD-10-CM | POA: Diagnosis not present

## 2022-10-07 DIAGNOSIS — E039 Hypothyroidism, unspecified: Secondary | ICD-10-CM | POA: Diagnosis not present

## 2022-10-14 DIAGNOSIS — E039 Hypothyroidism, unspecified: Secondary | ICD-10-CM | POA: Diagnosis not present

## 2022-11-07 DIAGNOSIS — R519 Headache, unspecified: Secondary | ICD-10-CM | POA: Diagnosis not present

## 2022-11-08 DIAGNOSIS — G43909 Migraine, unspecified, not intractable, without status migrainosus: Secondary | ICD-10-CM | POA: Diagnosis not present

## 2023-01-05 DIAGNOSIS — E039 Hypothyroidism, unspecified: Secondary | ICD-10-CM | POA: Diagnosis not present

## 2023-01-12 DIAGNOSIS — E039 Hypothyroidism, unspecified: Secondary | ICD-10-CM | POA: Diagnosis not present

## 2023-03-11 DIAGNOSIS — Z309 Encounter for contraceptive management, unspecified: Secondary | ICD-10-CM | POA: Diagnosis not present

## 2023-03-11 DIAGNOSIS — R300 Dysuria: Secondary | ICD-10-CM | POA: Diagnosis not present

## 2023-03-11 DIAGNOSIS — B3731 Acute candidiasis of vulva and vagina: Secondary | ICD-10-CM | POA: Diagnosis not present

## 2023-03-11 DIAGNOSIS — N760 Acute vaginitis: Secondary | ICD-10-CM | POA: Diagnosis not present

## 2023-03-11 DIAGNOSIS — R399 Unspecified symptoms and signs involving the genitourinary system: Secondary | ICD-10-CM | POA: Diagnosis not present

## 2023-04-02 DIAGNOSIS — H1033 Unspecified acute conjunctivitis, bilateral: Secondary | ICD-10-CM | POA: Diagnosis not present

## 2023-04-02 DIAGNOSIS — H579 Unspecified disorder of eye and adnexa: Secondary | ICD-10-CM | POA: Diagnosis not present

## 2023-07-28 IMAGING — US US MFM OB FOLLOW-UP
1 series · 13 of 28 positions shown · non-contrast
Comparison: none

[Series 1: us mfm ob follow-up · 73 acquisitions, 13 frames shown]
[im 3/73]
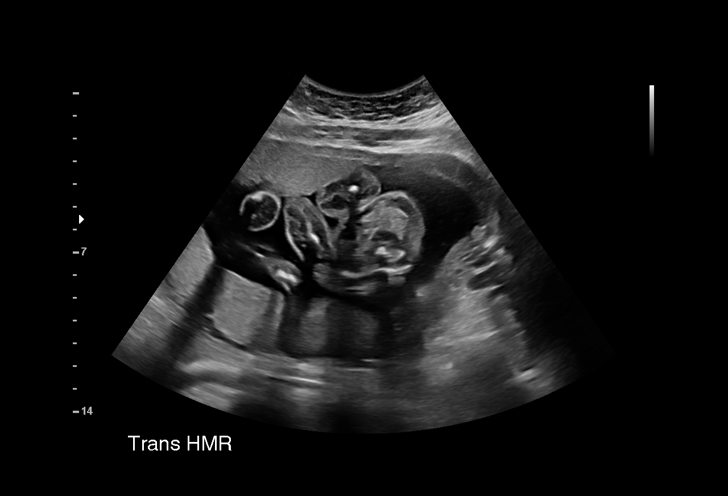
[im 9/73]
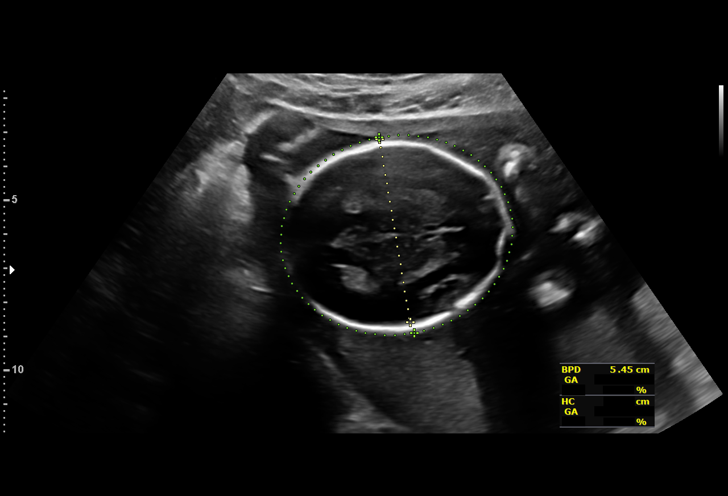
[im 14/73]
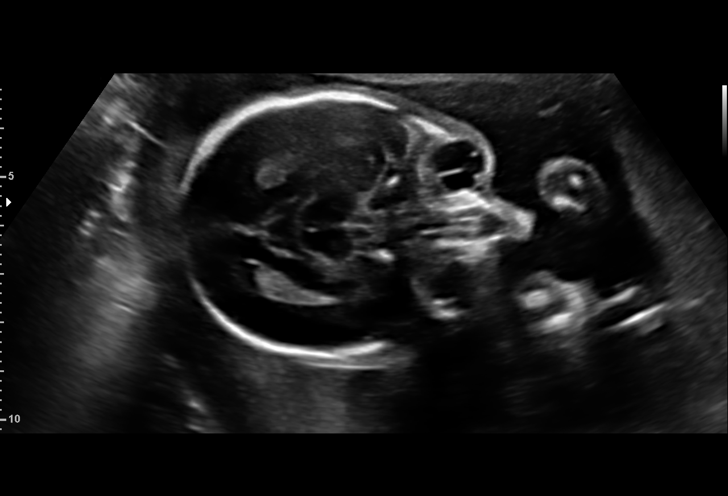
[im 19/73]
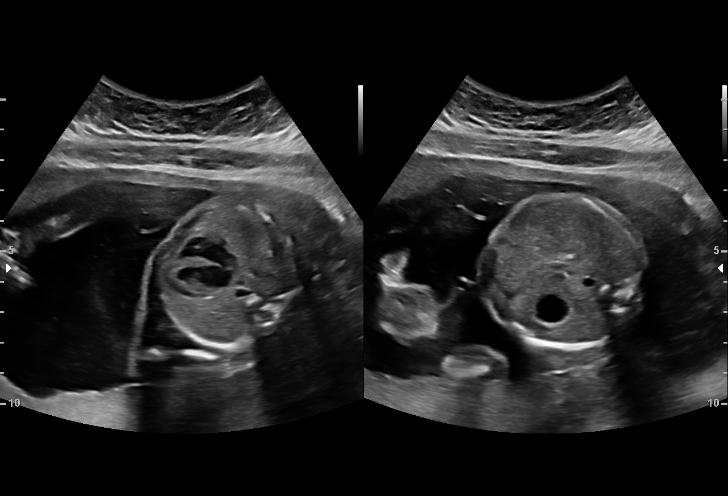
[im 25/73]
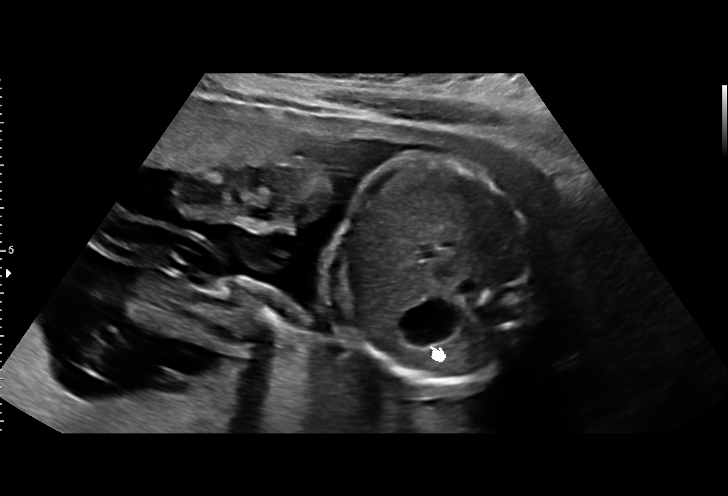
[im 30/73]
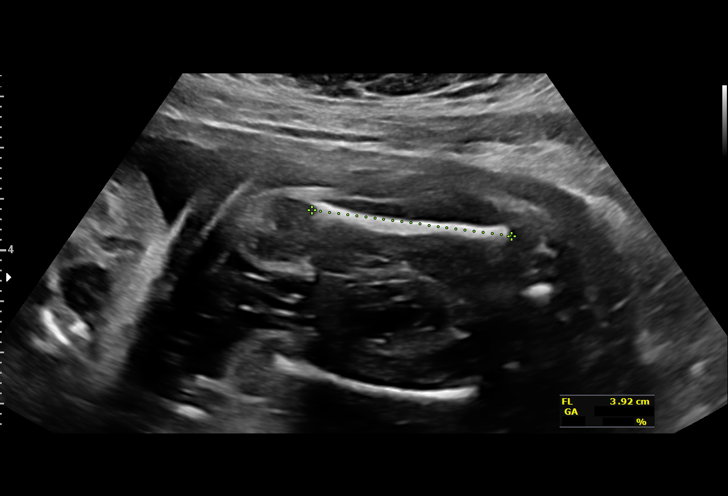
[im 38/73]
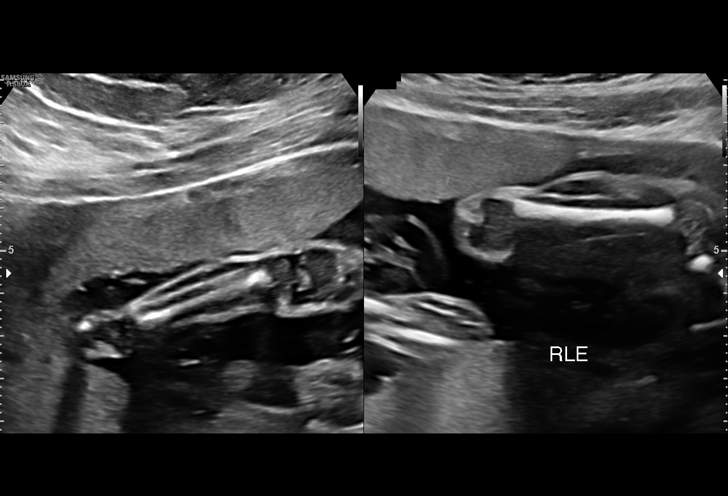
[im 43/73]
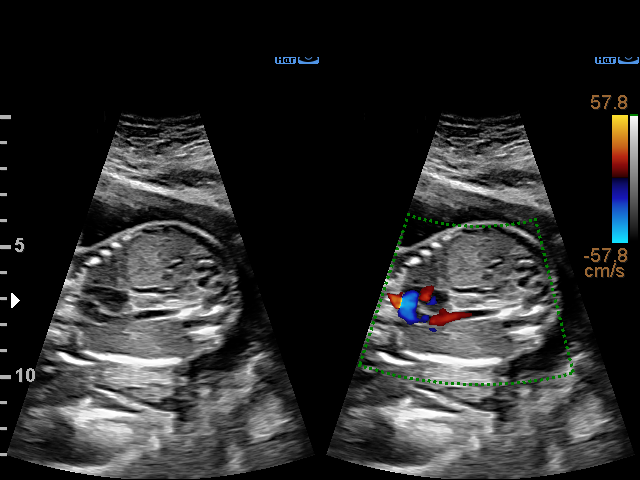
[im 49/73]
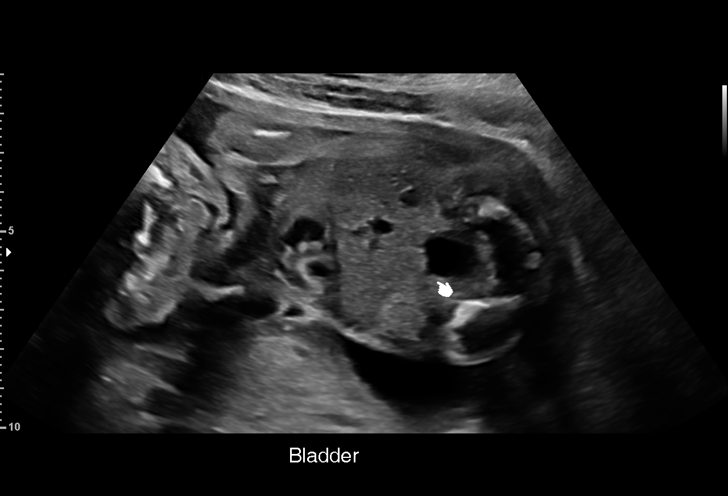
[im 54/73]
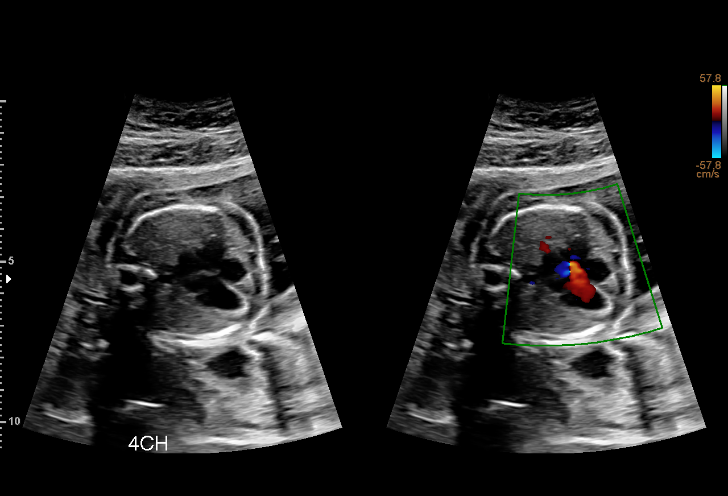
[im 59/73]
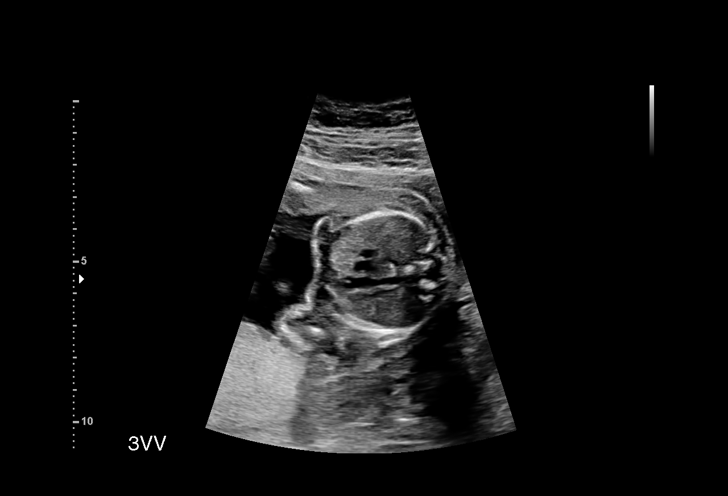
[im 65/73]
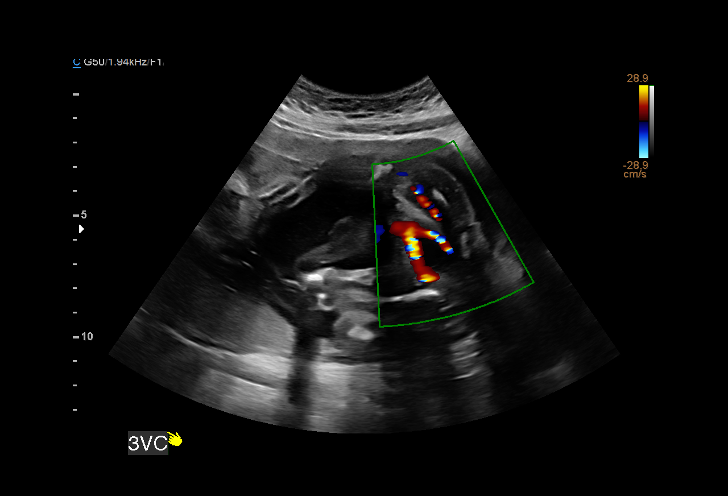
[im 70/73]
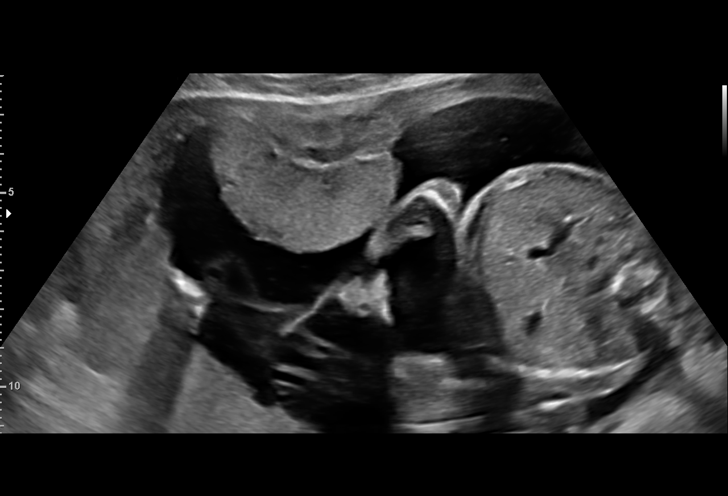

[13 of 28 positions shown; findings below may reference images not displayed]

LINCOLN CRUZ

Indications

 22 weeks gestation of pregnancy
 Encounter for antenatal screening for
 malformations
 Obesity complicating pregnancy, second
 trimester (BMI 37.9)
 Cystic Fibrosis (CF) Carrier, second trimester
 Genetic carrier (DMD)
Fetal Evaluation

 Num Of Fetuses:         1
 Fetal Heart Rate(bpm):  167
 Cardiac Activity:       Observed
 Presentation:           Transverse, head to maternal right
 Placenta:               Anterior Right lateral
 P. Cord Insertion:      Visualized

 Amniotic Fluid
 AFI FV:      Within normal limits

                             Largest Pocket(cm)

Biometry

 BPD:      54.5  mm     G. Age:  22w 4d         41  %    CI:        74.45   %    70 - 86
                                                         FL/HC:      19.3   %    19.2 -
 HC:      200.5  mm     G. Age:  22w 1d         18  %    HC/AC:      1.11        1.05 -
 AC:       181   mm     G. Age:  23w 0d         49  %    FL/BPD:     70.8   %    71 - 87
 FL:       38.6  mm     G. Age:  22w 3d         28  %    FL/AC:      21.3   %    20 - 24
 LV:        4.4  mm
 Est. FW:     522  gm      1 lb 2 oz     39  %
OB History

 Gravidity:    2         Term:   0
Gestational Age

 LMP:           22w 5d        Date:  10/19/21                 EDD:   07/26/22
 U/S Today:     22w 4d                                        EDD:   07/27/22
 Best:          22w 5d     Det. By:  LMP  (10/19/21)          EDD:   07/26/22
Anatomy

 Cranium:               Appears normal         LVOT:                   Previously seen
 Cavum:                 Previously seen        Aortic Arch:            Appears normal
 Ventricles:            Appears normal         Ductal Arch:            Not well visualized
 Choroid Plexus:        Previously seen        Diaphragm:              Appears normal
 Cerebellum:            Previously seen        Stomach:                Appears normal, left
                                                                       sided
 Posterior Fossa:       Previously seen        Abdomen:                Appears normal
 Nuchal Fold:           Not applicable (>20    Abdominal Wall:         Previously seen
                        wks GA)
 Face:                  Orbits and profile     Cord Vessels:           Previously seen
                        previously seen
 Lips:                  Previously seen        Kidneys:                Appear normal
 Palate:                Previously seen        Bladder:                Appears normal
 Thoracic:              Appears normal         Spine:                  Not well visualized
 Heart:                 Appears normal         Upper Extremities:      Appears normal
                        (4CH, axis, and
                        situs)
 RVOT:                  Appears normal         Lower Extremities:      Appears normal

 Other:  Fetus appears to be female. Nasal bone visualized. Hands and feet
         visualized. Lenses visualized. previously VC, 3VV and 3VTV
         visualized.
Cervix Uterus Adnexa

 Cervix
 Length:           4.82  cm.
 Normal appearance by transabdominal scan.
Impression
 Patient returned for completion of fetal anatomy.
 On cell-free fetal DNA screening, the risks of fetal
 aneuploidies are not increased.  It showed a likelihood of
 trisomy X possibly of maternal origin.
 Subsequently, the patient had genetic counseling and had
 maternal karyotype that confirmed trisomy X syndrome
 (47,XXX).  Maternal MicroArray showed "Gain of Whole X
 Chromosome; 1.1 KB interstitial Duplication of 2P14>14
 (variant of uncertain significance).
 Patient was counseled by me and our genetic counselor that
 the results show variant of uncertain significance.  She was
 also reassured that she is not a carrier of Duchenne
 muscular dystrophy gene mutation.
 Patient was counseled that the likelihood of fetal trisomy X is
 highly unlikely.
 Amniotic fluid is normal and good fetal activity seen.  Fetal
 growth is appropriate for gestational age.  Fetal anatomical
 survey was completed and appears normal.  The spine could
 not be evaluated clearly.
 I reassured the patient of the findings.

Recommendations

 -I recommend fetal growth assessment at 32 and 36 weeks
 gestation that may be performed at your office.
                Panduro, Odila

## 2023-10-23 DIAGNOSIS — M79671 Pain in right foot: Secondary | ICD-10-CM | POA: Diagnosis not present

## 2023-10-23 DIAGNOSIS — M109 Gout, unspecified: Secondary | ICD-10-CM | POA: Diagnosis not present

## 2024-01-13 DIAGNOSIS — E039 Hypothyroidism, unspecified: Secondary | ICD-10-CM | POA: Diagnosis not present

## 2024-01-20 DIAGNOSIS — E039 Hypothyroidism, unspecified: Secondary | ICD-10-CM | POA: Diagnosis not present

## 2024-05-30 DIAGNOSIS — Z13220 Encounter for screening for lipoid disorders: Secondary | ICD-10-CM | POA: Diagnosis not present

## 2024-05-30 DIAGNOSIS — Z0000 Encounter for general adult medical examination without abnormal findings: Secondary | ICD-10-CM | POA: Diagnosis not present

## 2024-05-30 DIAGNOSIS — Z131 Encounter for screening for diabetes mellitus: Secondary | ICD-10-CM | POA: Diagnosis not present

## 2024-05-30 DIAGNOSIS — Z1331 Encounter for screening for depression: Secondary | ICD-10-CM | POA: Diagnosis not present

## 2024-05-30 DIAGNOSIS — Z01419 Encounter for gynecological examination (general) (routine) without abnormal findings: Secondary | ICD-10-CM | POA: Diagnosis not present

## 2024-07-19 DIAGNOSIS — R635 Abnormal weight gain: Secondary | ICD-10-CM | POA: Diagnosis not present

## 2024-07-19 DIAGNOSIS — E039 Hypothyroidism, unspecified: Secondary | ICD-10-CM | POA: Diagnosis not present

## 2024-08-16 DIAGNOSIS — J029 Acute pharyngitis, unspecified: Secondary | ICD-10-CM | POA: Diagnosis not present

## 2024-08-16 DIAGNOSIS — B9789 Other viral agents as the cause of diseases classified elsewhere: Secondary | ICD-10-CM | POA: Diagnosis not present

## 2024-08-16 DIAGNOSIS — H9209 Otalgia, unspecified ear: Secondary | ICD-10-CM | POA: Diagnosis not present

## 2024-08-16 DIAGNOSIS — J019 Acute sinusitis, unspecified: Secondary | ICD-10-CM | POA: Diagnosis not present
